# Patient Record
Sex: Male | Born: 1950 | Race: Black or African American | Hispanic: No | Smoking: Current some day smoker
Health system: Southern US, Community
[De-identification: ages and names within clinical notes are randomized; demographics above are authoritative.]

## PROBLEM LIST (undated history)

## (undated) DIAGNOSIS — E119 Type 2 diabetes mellitus without complications: Secondary | ICD-10-CM

## (undated) DIAGNOSIS — E7800 Pure hypercholesterolemia, unspecified: Secondary | ICD-10-CM

## (undated) DIAGNOSIS — C801 Malignant (primary) neoplasm, unspecified: Secondary | ICD-10-CM

## (undated) DIAGNOSIS — I10 Essential (primary) hypertension: Secondary | ICD-10-CM

## (undated) DIAGNOSIS — K219 Gastro-esophageal reflux disease without esophagitis: Secondary | ICD-10-CM

## (undated) HISTORY — DX: Pure hypercholesterolemia, unspecified: E78.00

## (undated) HISTORY — PX: THROAT SURGERY: SHX803

## (undated) HISTORY — DX: Gastro-esophageal reflux disease without esophagitis: K21.9

## (undated) HISTORY — PX: ANKLE SURGERY: SHX546

## (undated) HISTORY — PX: PROSTATE SURGERY: SHX751

---

## 2003-09-15 ENCOUNTER — Emergency Department (HOSPITAL_COMMUNITY): Admission: EM | Admit: 2003-09-15 | Discharge: 2003-09-15 | Payer: Self-pay | Admitting: Emergency Medicine

## 2008-06-16 ENCOUNTER — Emergency Department (HOSPITAL_COMMUNITY): Admission: EM | Admit: 2008-06-16 | Discharge: 2008-06-16 | Payer: Self-pay | Admitting: Emergency Medicine

## 2008-09-24 DEATH — deceased

## 2008-10-19 ENCOUNTER — Emergency Department (HOSPITAL_COMMUNITY): Admission: EM | Admit: 2008-10-19 | Discharge: 2008-10-19 | Payer: Self-pay | Admitting: Emergency Medicine

## 2010-11-25 ENCOUNTER — Encounter: Payer: Self-pay | Admitting: *Deleted

## 2010-11-25 ENCOUNTER — Emergency Department (HOSPITAL_COMMUNITY)
Admission: EM | Admit: 2010-11-25 | Discharge: 2010-11-25 | Disposition: A | Discharge status: Home / Self Care | Payer: PRIVATE HEALTH INSURANCE | Attending: Emergency Medicine | Admitting: Emergency Medicine

## 2010-11-25 DIAGNOSIS — F17200 Nicotine dependence, unspecified, uncomplicated: Secondary | ICD-10-CM | POA: Insufficient documentation

## 2010-11-25 DIAGNOSIS — Z7982 Long term (current) use of aspirin: Secondary | ICD-10-CM | POA: Insufficient documentation

## 2010-11-25 DIAGNOSIS — I10 Essential (primary) hypertension: Secondary | ICD-10-CM | POA: Insufficient documentation

## 2010-11-25 DIAGNOSIS — L0100 Impetigo, unspecified: Secondary | ICD-10-CM | POA: Insufficient documentation

## 2010-11-25 HISTORY — DX: Essential (primary) hypertension: I10

## 2010-11-25 MED ORDER — DOXYCYCLINE HYCLATE 100 MG PO CAPS: 100.0000 mg | ORAL_CAPSULE | Freq: Two times a day (BID) | ORAL | Status: AC

## 2010-11-25 NOTE — ED Provider Notes (Addendum)
History    Scribed for Shelda Jakes, MD, the patient was seen in room APA06/APA06. This chart was scribed by Clarita Crane. This patient's care was started at 8:41AM.  Chief Complaint  Patient presents with  . Wound Infection   HPI Patient c/o bulging abscess to medial aspect of right ankle and a smaller abscess to anterior aspect of right lower leg with a yellow drainage just distal to right knee with associated pruritus onset 4 days ago and worsening since. Patient denies associated pain with abscesses, fever and chills. Patient notes the abscesses initially started as small areas of discoloration to skin and grew progressing to bulging abscesses.Patient repots h/o of similar symptoms with last episode of similar abscesses occuring 2 years ago with the abscesses resolving approximately 3 weeks after onset. Patient reports he was evaluated at [facility] yesterday and dx with impetigo and given a shot of Rocephin-250 mg at [facility]. Reports h/o hypertension and denies h/o diabetes.   PAST MEDICAL HISTORY:  Past Medical History  Diagnosis Date  . Hypertension     PAST SURGICAL HISTORY:  Past Surgical History  Procedure Date  . Ankle surgery     MEDICATIONS:  Previous Medications   ASPIRIN EC 81 MG TABLET    Take 81 mg by mouth daily.     ATENOLOL-CHLORTHALIDONE (TENORETIC) 50-25 MG PER TABLET    Take 0.5 tablets by mouth daily.     MULTIPLE VITAMIN (MULTI VITAMIN MENS) TABLET    Take 1 tablet by mouth daily.     SIMVASTATIN (ZOCOR) 80 MG TABLET    Take 80 mg by mouth at bedtime.       ALLERGIES:  Allergies as of 11/25/2010 - Review Complete 11/25/2010  Allergen Reaction Noted  . Sulfa antibiotics Itching 11/25/2010     FAMILY HISTORY:  Family History  Problem Relation Age of Onset  . Hypertension Mother   . Diabetes Maternal Aunt      SOCIAL HISTORY: History   Social History  . Marital Status: Married    Spouse Name: N/A    Number of  Children: N/A  . Years of Education: N/A   Social History Main Topics  . Smoking status: Current Everyday Smoker  . Smokeless tobacco: None  . Alcohol Use: Yes     occasional  . Drug Use: No  . Sexually Active:    Other Topics Concern  . None   Social History Narrative  . None       Review of Systems  Constitutional: Negative for fever and chills.  HENT: Negative for rhinorrhea and neck pain.   Eyes: Negative for pain.  Respiratory: Negative for cough and shortness of breath.   Cardiovascular: Negative for chest pain.  Gastrointestinal: Negative for nausea, vomiting, abdominal pain and diarrhea.  Genitourinary: Negative for dysuria.  Musculoskeletal: Negative for back pain.  Skin: Negative for rash.       Multiple skin lesions to right lower extremity. Pruritus. Draining lesion.   Neurological: Negative for dizziness and weakness.    Physical Exam  BP 113/84  Pulse 76  Temp 98.2 F (36.8 C)  Resp 20  Wt 190 lb (86.183 kg)  SpO2 100%  Physical Exam  Nursing note and vitals reviewed. Constitutional: He is oriented to person, place, and time. Vital signs are normal. He appears well-developed and well-nourished. No distress.  HENT:  Head: Normocephalic and atraumatic.  Eyes: Conjunctivae are normal. Pupils are equal, round, and reactive to light.  Neck: Neck supple.  Cardiovascular: Normal rate, regular rhythm and normal heart sounds.  Exam reveals no gallop and no friction rub.   No murmur heard.      Distal capillary refill intact.   Pulmonary/Chest: Effort normal and breath sounds normal. He has no wheezes.  Abdominal: Soft. Bowel sounds are normal. He exhibits no distension. There is no tenderness.  Musculoskeletal: Normal range of motion. He exhibits no edema.  Neurological: He is alert and oriented to person, place, and time. No sensory deficit.  Skin: Skin is warm and dry.       Small 5mm lesion with surrounding redness and no blister to medial aspect of  right knee. Lesion 2cm in size just distal to right knee with red base, open blister cover with non purulent honey-colored discharge and no surrounding eythema. 3cm lesion to medial aspect of right ankle 3cm in size with large blister intact. Small punctate lesion to medial aspect of right foot 2mm in size with mild surrounding erythema.  Psychiatric: He has a normal mood and affect. His behavior is normal.    ED Course  Procedures  OTHER DATA REVIEWED: Nursing notes, vital signs, and past medical records reviewed.   LABS / RADIOLOGY: No results found for this or any previous visit. No results found.  ED COURSE / COORDINATION OF CARE: 8:52AM- Patient informed of reasons to return to ED and pertinent at [facility] care instructions. Further explained intent to provide patient with prescription for Doxycycline and discharge home. Patient agrees to plan set forth at this time.  MDM: Differential Diagnosis:   [facility] SUSPECT IMPETIGO AND HAS TREATED THAT APPROPRIATELY WITH ROCHEPH IM THERE AND THEN TOPICAL ANTIBIOTIC MOST LIKELY BATROBAN. WILL ADD DOXY IN CASE SECONDARY MRSA BUT D/C IS HONEY COLORED. ANOTHER POSSIBILITY IS SOME TYPE OF DERMATOLOGIC BULLOUS IMMUNE RESPONSE. WILL TREAT AS IMPETIGO FOR NOW.  I personally performed the services described in this documentation, which was scribed in my presence. The recorded information has been reviewed and considered. No att. providers found   PLAN: Discharge The patient is to return the emergency department if there is any worsening of symptoms. I have reviewed the discharge instructions with the patient/family   CONDITION ON DISCHARGE: Stable, Good   MEDICATIONS GIVEN IN THE E.D.  Medications  simvastatin (ZOCOR) 80 MG tablet (not administered)  atenolol-chlorthalidone (TENORETIC) 50-25 MG per tablet (not administered)  aspirin EC 81 MG tablet (not administered)  Multiple Vitamin (MULTI VITAMIN MENS) tablet (not  administered)         Shelda Jakes, MD 11/25/10 1610  Shelda Jakes, MD 11/25/10 [phone]

## 2010-11-25 NOTE — ED Notes (Signed)
Pt has 2 large blisters on his right leg.

## 2014-02-12 ENCOUNTER — Ambulatory Visit (INDEPENDENT_AMBULATORY_CARE_PROVIDER_SITE_OTHER): Payer: Medicare HMO | Admitting: Orthopedic Surgery

## 2014-02-12 ENCOUNTER — Ambulatory Visit (INDEPENDENT_AMBULATORY_CARE_PROVIDER_SITE_OTHER): Payer: Medicare HMO

## 2014-02-12 ENCOUNTER — Encounter: Payer: Self-pay | Admitting: Orthopedic Surgery

## 2014-02-12 VITALS — BP 133/87 | Ht 68.0 in | Wt 190.0 lb

## 2014-02-12 DIAGNOSIS — D1724 Benign lipomatous neoplasm of skin and subcutaneous tissue of left leg: Secondary | ICD-10-CM

## 2014-02-12 DIAGNOSIS — M25561 Pain in right knee: Secondary | ICD-10-CM

## 2014-02-12 DIAGNOSIS — M7121 Synovial cyst of popliteal space [Baker], right knee: Secondary | ICD-10-CM

## 2014-02-12 NOTE — Progress Notes (Signed)
Chief Complaint  Patient presents with  . Cyst    Right posterior knee cyst, referred by Dr. Alford Highland    The patient presents and says he has a cyst in his knee for the last 4 years. He was told this by his doctor. He had no imaging however. He took some Advil as needed did not help he complains of pain in the back of his knee with swelling catching stiffness throbbing burning stabbing and aching and rated 9/10 and says it's constant it's worse with walking he says his knee doesn't hurt in the front at all just in the popliteal fossa.  Medical history diabetes hypertension and cancer Past history of prostate cancer surgery ankle surgery throat surgery Medication lisinopril 10 atenolol 25 at her best and 40 metformin 1000 omeprazole 20 aspirin 81  Sulfa medication allergy  Family history diabetes hypertension cancer  Social history no smoking no drinking no drug use  Review of Systems  HENT: Positive for congestion.   Respiratory: Positive for wheezing.   Gastrointestinal: Positive for constipation.  Musculoskeletal: Positive for joint pain.  Skin: Positive for rash.  Endo/Heme/Allergies: Positive for environmental allergies.  All other systems reviewed and are negative.   BP 133/87  Ht 5\' 8"  (1.727 m)  Wt 190 lb (86.183 kg)  BMI 28.90 kg/m2  Grooming hygiene are normal he's normal in terms of body habitus. He has good peripheral pulses bilaterally with normal temperature no edema no swelling lymph nodes are negative in the groin gait and station is normal Right knee mass compared to left there is some fullness and tenderness there I can't tell if it's a cyst or lipoma is nontender in his knee no effusion knee flexion passively 125 knee stable has valgus alignment normal muscle strength and muscle tone scans normal without rash sensation is normal he is oriented x3 his mood and affect is normal and his reflexes were normal  His x-ray I interpret as osteoarthritis with valgus  alignment to the right knee  Recommend MRI lipoma versus cyst   Notes reviewed today include office notes proximally a page is. The main information gained from that visit the patient is under good control for his diabetes and hypertension. He previously saw [facility] did not pay his bills and they won't see him again.

## 2014-02-26 ENCOUNTER — Ambulatory Visit: Payer: Commercial Managed Care - HMO | Admitting: Orthopedic Surgery

## 2014-02-27 ENCOUNTER — Ambulatory Visit (HOSPITAL_COMMUNITY)
Admission: RE | Admit: 2014-02-27 | Discharge: 2014-02-27 | Disposition: A | Discharge status: Home / Self Care | Payer: Medicare HMO | Source: Ambulatory Visit | Attending: Orthopedic Surgery | Admitting: Orthopedic Surgery

## 2014-02-27 DIAGNOSIS — S83241A Other tear of medial meniscus, current injury, right knee, initial encounter: Secondary | ICD-10-CM | POA: Diagnosis not present

## 2014-02-27 DIAGNOSIS — M25461 Effusion, right knee: Secondary | ICD-10-CM | POA: Diagnosis not present

## 2014-02-27 DIAGNOSIS — X58XXXA Exposure to other specified factors, initial encounter: Secondary | ICD-10-CM | POA: Insufficient documentation

## 2014-02-27 DIAGNOSIS — M7121 Synovial cyst of popliteal space [Baker], right knee: Secondary | ICD-10-CM | POA: Insufficient documentation

## 2014-02-27 DIAGNOSIS — M25561 Pain in right knee: Secondary | ICD-10-CM | POA: Insufficient documentation

## 2014-03-08 ENCOUNTER — Telehealth: Payer: Self-pay | Admitting: Orthopedic Surgery

## 2014-03-08 NOTE — Telephone Encounter (Signed)
Patient has called to request MRI results by phone.  He had an appointment scheduled for 02/26/14(based on MRI, if had been approved and done) and  the MRI was scheduled and done on 02/27/14.  May he receive results and be further advised by phone?  His contact #'s are:   (home) [phone], and (cell) [phone].

## 2014-03-11 NOTE — Telephone Encounter (Signed)
He has torn cartilage and severe arthritis in the knee   Needs to be re evaluated unless his knee is not bothering him

## 2014-03-12 NOTE — Telephone Encounter (Signed)
Routing to nurse to relay to patient per Dr Ruthe Mannan reviewing of results.

## 2014-03-12 NOTE — Telephone Encounter (Signed)
Patient aware and will call us as needed

## 2014-03-18 NOTE — Telephone Encounter (Signed)
03/18/14 Call back received from patient and wife, following up on the results received.  I reviewed the note per nurse and per Dr Aline Brochure, to come back in for re-evaluation, unless knee is not bothering him.  Patient said that the knee is still bothering him, as it has for the last 2 years.  I offered appointment, however, patient and wife states does not wish to have a visit because of having to pay a copay.  Patient does wish to have nurse call him back at above phone #.Marland Kitchen

## 2015-06-03 ENCOUNTER — Encounter: Payer: Self-pay | Admitting: *Deleted

## 2015-06-17 ENCOUNTER — Encounter (INDEPENDENT_AMBULATORY_CARE_PROVIDER_SITE_OTHER): Payer: Self-pay | Admitting: *Deleted

## 2015-07-16 ENCOUNTER — Encounter (INDEPENDENT_AMBULATORY_CARE_PROVIDER_SITE_OTHER): Payer: Self-pay | Admitting: Internal Medicine

## 2015-07-16 ENCOUNTER — Ambulatory Visit (INDEPENDENT_AMBULATORY_CARE_PROVIDER_SITE_OTHER): Payer: Medicare HMO | Admitting: Internal Medicine

## 2015-07-16 VITALS — BP 146/90 | HR 64 | Temp 97.6°F | Ht 67.0 in | Wt 193.1 lb

## 2015-07-16 DIAGNOSIS — R1013 Epigastric pain: Secondary | ICD-10-CM

## 2015-07-16 DIAGNOSIS — R140 Abdominal distension (gaseous): Secondary | ICD-10-CM

## 2015-07-16 DIAGNOSIS — K219 Gastro-esophageal reflux disease without esophagitis: Secondary | ICD-10-CM | POA: Diagnosis not present

## 2015-07-16 DIAGNOSIS — R1900 Intra-abdominal and pelvic swelling, mass and lump, unspecified site: Secondary | ICD-10-CM | POA: Diagnosis not present

## 2015-07-16 DIAGNOSIS — I10 Essential (primary) hypertension: Secondary | ICD-10-CM | POA: Insufficient documentation

## 2015-07-16 DIAGNOSIS — E7800 Pure hypercholesterolemia, unspecified: Secondary | ICD-10-CM | POA: Insufficient documentation

## 2015-07-16 DIAGNOSIS — E119 Type 2 diabetes mellitus without complications: Secondary | ICD-10-CM | POA: Insufficient documentation

## 2015-07-16 NOTE — Progress Notes (Signed)
   Subjective:    Patient ID: Nicholas Vaughan, male    DOB: 07-02-1950, 65 y.o.   MRN: [id number]  HPI Referred by Dr. Leonie Douglas ([facility]) For abdominal bloating, flatus. He tells me he has a lot of bloating and flatus. He says this occurred about 2 months ago. His Omeprazole was increased to twice a day and bloating resolved.    There is no abdominal pain. He does say he pulled a muscle in his left flank from mopping.  He usually has a BM daily and sometimes twice a day. No melena or BRRB. No change in his stools.  Appetite is good. No weight loss. He is not having "heart burn". Has been on Omeprazole for years.   He last colonoscopy was at age 73 in [place] and it was normal. He had 3 polyps. He says his next colonoscopy will be when he turns 42.   Hx of diabetes, hypertension, high cholesterol.   Review of Systems Past Medical History  Diagnosis Date  . Hypertension   . GERD (gastroesophageal reflux disease)   . High cholesterol     Past Surgical History  Procedure Laterality Date  . Ankle surgery    . Prostate surgery      2013 Prostate cancer    Allergies  Allergen Reactions  . Sulfa Antibiotics Itching    Current Outpatient Prescriptions on File Prior to Visit  Medication Sig Dispense Refill  . aspirin EC 81 MG tablet Take 81 mg by mouth daily.      Marland Kitchen atenolol (TENORMIN) 25 MG tablet Take 25 mg by mouth daily.    Marland Kitchen atorvastatin (LIPITOR) 40 MG tablet Take 40 mg by mouth daily.    . metFORMIN (GLUCOPHAGE) 1000 MG tablet Take 1,000 mg by mouth 2 (two) times daily with a meal.    . Multiple Vitamin (MULTI VITAMIN MENS) tablet Take 1 tablet by mouth daily.      Marland Kitchen omeprazole (PRILOSEC) 20 MG capsule Take 20 mg by mouth daily.     No current facility-administered medications on file prior to visit.        Objective:   Physical Exam Blood pressure 146/90, pulse 64, temperature 97.6 F (36.4 C), height 5\' 7"  (1.702 m), weight 193 lb  1.6 oz (87.59 kg). Alert and oriented. Skin warm and dry. Oral mucosa is moist.   . Sclera anicteric, conjunctivae is pink. Thyroid not enlarged. No cervical lymphadenopathy. Lungs clear. Heart regular rate and rhythm.  Abdomen is soft. Bowel sounds are positive. No hepatomegaly. No abdominal masses felt. No tenderness.  No edema to lower extremities.  Stool guaiac negative.     CARD   Lot [id number] Ex 9/17      Assessment & Plan:  GERD controlled with Omeprazole. Bloating/abdomen becomes tense. Am going to get a CT just to be sure we are not missing anything.   OV in 3 months.

## 2015-07-16 NOTE — Patient Instructions (Signed)
CT abdomen/pelvis with GM

## 2015-07-21 ENCOUNTER — Ambulatory Visit (INDEPENDENT_AMBULATORY_CARE_PROVIDER_SITE_OTHER): Payer: Medicare HMO

## 2015-07-21 ENCOUNTER — Ambulatory Visit (INDEPENDENT_AMBULATORY_CARE_PROVIDER_SITE_OTHER): Payer: Medicare HMO | Admitting: Orthopedic Surgery

## 2015-07-21 VITALS — Ht 67.0 in | Wt 193.0 lb

## 2015-07-21 DIAGNOSIS — S83281D Other tear of lateral meniscus, current injury, right knee, subsequent encounter: Secondary | ICD-10-CM

## 2015-07-21 DIAGNOSIS — M25561 Pain in right knee: Secondary | ICD-10-CM

## 2015-07-21 DIAGNOSIS — M1711 Unilateral primary osteoarthritis, right knee: Secondary | ICD-10-CM

## 2015-07-21 DIAGNOSIS — M7121 Synovial cyst of popliteal space [Baker], right knee: Secondary | ICD-10-CM

## 2015-07-21 MED ORDER — TRAMADOL-ACETAMINOPHEN 37.5-325 MG PO TABS: 1.0000 | ORAL_TABLET | Freq: Four times a day (QID) | ORAL | Status: DC | PRN

## 2015-07-21 NOTE — Progress Notes (Signed)
Patient ID: Nicholas Vaughan, male   DOB: 09-06-1950, 65 y.o.   MRN: [id number]  Chief Complaint  Patient presents with  . Knee Pain    Right knee pain   Last seen October 2020 thousand 15 HPI Nicholas Vaughan is a 65 y.o. male.  This gentleman has pain in his right knee radiates down to his right foot. He has posterior pain with a Baker's cyst. I saw him before you lateral meniscus and osteoarthritis. He was lost to follow-up  He complains planes now on repeat presentation with right knee pain dull throbbing aching unrelieved by Advil moderate in severity present now for about 6 years worse with exercise and walking. Visual giving way symptoms.  Review of Systems Review of Systems  HENT: Positive for dental problem and sinus pressure.   Musculoskeletal: Positive for arthralgias.  Allergic/Immunologic: Positive for environmental allergies.  Neurological:       Burning pain in his legs    Past Medical History  Diagnosis Date  . Hypertension   . GERD (gastroesophageal reflux disease)   . High cholesterol     Past Surgical History  Procedure Laterality Date  . Ankle surgery    . Prostate surgery      2013 Prostate cancer    Family History  Problem Relation Age of Onset  . Diabetes Maternal Aunt     Social History Social History  Substance Use Topics  . Smoking status: Current Every Day Smoker  . Smokeless tobacco: Not on file  . Alcohol Use: 0.0 oz/week    0 Standard drinks or equivalent per week     Comment: occasional once a week    Allergies  Allergen Reactions  . Sulfa Antibiotics Itching    Current Outpatient Prescriptions  Medication Sig Dispense Refill  . aspirin EC 81 MG tablet Take 81 mg by mouth daily.      Marland Kitchen atenolol (TENORMIN) 25 MG tablet Take 25 mg by mouth daily.    Marland Kitchen atorvastatin (LIPITOR) 40 MG tablet Take 40 mg by mouth daily.    . cetirizine (ZYRTEC) 10 MG tablet Take 10 mg by mouth daily.    . metFORMIN (GLUCOPHAGE) 1000 MG tablet Take  1,000 mg by mouth 2 (two) times daily with a meal.    . Multiple Vitamin (MULTI VITAMIN MENS) tablet Take 1 tablet by mouth daily.      Marland Kitchen omeprazole (PRILOSEC) 20 MG capsule Take 20 mg by mouth daily.    Marland Kitchen senna-docusate (SENOKOT-S) 8.6-50 MG tablet Take 1 tablet by mouth daily.    . traMADol-acetaminophen (ULTRACET) 37.5-325 MG tablet Take 1 tablet by mouth every 6 (six) hours as needed. 84 tablet 0   No current facility-administered medications for this visit.       Physical Exam Physical Exam Height 5\' 7"  (1.702 m), weight 193 lb (87.544 kg). Appearance, there are no abnormalities in terms of appearance the patient was well-developed and well-nourished. The grooming and hygiene were normal.  Mental status orientation, there was normal alertness and orientation Mood pleasant Ambulatory status normal with no assistive devices  Examination of the right knee Inspection  lateral joint line tenderness , no effusion. Range of motion 115 Tests for stability normal Motor strength  5 out of 5 quadriceps strength no atrophy no tremors  Positive McMurray sign for lateral meniscus tear  Skin warm dry and intact without laceration or ulceration or erythema Neurologic examination normal sensation Vascular examination normal pulses with warm extremity and normal capillary  refill  The opposite knee reveals no swelling or effusion, full range of motion, normal muscle tone, anterior and posterior drawer test stable neurovascular exam intact  Data Reviewed X-rays today show arthritis in his knee previous MRI showed torn meniscus  Assessment   torn lateral meniscus right knee knee   Plan  I questioned him extensively about his back he has no tenderness or pain there no pain in his hip or upper thigh just pain in the knee with giving way symptoms. He would like to proceed with arthroscopic surgery understanding that he may need total knee replacement in the future  We gave him some  Ultracet for pain is Advil was not controlling his pain taking 1 every other day

## 2015-07-24 ENCOUNTER — Ambulatory Visit (HOSPITAL_COMMUNITY)
Admission: RE | Admit: 2015-07-24 | Discharge: 2015-07-24 | Disposition: A | Discharge status: Home / Self Care | Payer: Medicare HMO | Source: Ambulatory Visit | Attending: Internal Medicine | Admitting: Internal Medicine

## 2015-07-24 DIAGNOSIS — N200 Calculus of kidney: Secondary | ICD-10-CM | POA: Diagnosis not present

## 2015-07-24 DIAGNOSIS — R1900 Intra-abdominal and pelvic swelling, mass and lump, unspecified site: Secondary | ICD-10-CM | POA: Insufficient documentation

## 2015-07-24 DIAGNOSIS — R1013 Epigastric pain: Secondary | ICD-10-CM | POA: Diagnosis present

## 2015-07-24 DIAGNOSIS — R140 Abdominal distension (gaseous): Secondary | ICD-10-CM | POA: Diagnosis present

## 2015-07-24 LAB — POCT I-STAT CREATININE: CREATININE: 1.1 mg/dL (ref 0.61–1.24)

## 2015-07-24 MED ORDER — IOHEXOL 300 MG/ML  SOLN
100.0000 mL | Freq: Once | INTRAMUSCULAR | Status: AC | PRN
  Administered 2015-07-24: 100 mL via INTRAVENOUS

## 2015-09-25 ENCOUNTER — Ambulatory Visit (INDEPENDENT_AMBULATORY_CARE_PROVIDER_SITE_OTHER): Payer: Medicare HMO | Admitting: Orthopedic Surgery

## 2015-09-25 VITALS — BP 109/64 | HR 80 | Ht 65.75 in | Wt 188.6 lb

## 2015-09-25 DIAGNOSIS — S83281D Other tear of lateral meniscus, current injury, right knee, subsequent encounter: Secondary | ICD-10-CM | POA: Diagnosis not present

## 2015-09-25 NOTE — Patient Instructions (Signed)
You have decided to proceed with operative arthroscopy of the knee. You have decided not to continue with nonoperative measures such as but not limited to oral medication, weight loss, activity modification, physical therapy, bracing, or injection.  We will perform operative arthroscopy of the knee. Some of the risks associated with arthroscopic surgery of the knee include but are not limited to Bleeding Infection Swelling Stiffness Blood clot Pain  If you're not comfortable with these risks and would like to continue with nonoperative treatment please let Dr. Harrison know prior to your surgery. 

## 2015-09-25 NOTE — Progress Notes (Signed)
Patient ID: Nicholas Vaughan, male   DOB: Apr 22, 1951, 65 y.o.   MRN: [id number]  Chief Complaint  Patient presents with  . Follow-up    Right knee discuss surgery    HPI Nicholas Vaughan is decided to go ahead with surgery on his right knee complains of bilateral knee pain stiffness swelling a Baker's cyst  ROS  Is not febrile   BP 109/64 mmHg  Pulse 80  Ht 5' 5.75" (1.67 m)  Wt 188 lb 9.6 oz (85.548 kg)  BMI 30.67 kg/m2 Gen. appearance is normal grooming and hygiene Orientation to person place and time normal Mood normal Gait is SLIGHTLY ABNORMAL No peripheral edema or swelling is noted in the LEG- RIGHT  Sensory exam shows normal sensation to palpation, pressure and soft touch Skin exam no lacerations ulcerations or erythema  Ortho Exam   lateral tenderness positive McMurray's for lateral joint pain knee flexion limited 100  A/P  Medical decision-surgical arthroscopy right knee partial lateral meniscectomy at patient's convenience

## 2015-09-26 NOTE — Addendum Note (Signed)
Addended by: Baldomero Lamy B on: 09/26/2015 09:41 AM   Modules accepted: Orders

## 2015-10-08 ENCOUNTER — Telehealth: Payer: Self-pay | Admitting: Orthopaedic Surgery

## 2015-10-08 NOTE — Telephone Encounter (Signed)
Nicholas Vaughan called asking to speak with you. He said that you had left a message for him to call you back.  Please call and advise.

## 2015-10-14 ENCOUNTER — Encounter: Payer: Self-pay | Admitting: Orthopedic Surgery

## 2015-10-14 NOTE — Telephone Encounter (Signed)
Patient stopped in to find out about surgery - said missed nurse's call back regarding pre-op and surgery information.  Also needs work note.

## 2015-10-16 ENCOUNTER — Encounter (INDEPENDENT_AMBULATORY_CARE_PROVIDER_SITE_OTHER): Payer: Self-pay | Admitting: Internal Medicine

## 2015-10-16 ENCOUNTER — Ambulatory Visit (INDEPENDENT_AMBULATORY_CARE_PROVIDER_SITE_OTHER): Payer: Medicare HMO | Admitting: Internal Medicine

## 2015-10-16 ENCOUNTER — Telehealth: Payer: Self-pay | Admitting: Orthopedic Surgery

## 2015-10-16 VITALS — BP 104/60 | HR 64 | Temp 98.1°F | Ht 66.0 in | Wt 188.4 lb

## 2015-10-16 DIAGNOSIS — K219 Gastro-esophageal reflux disease without esophagitis: Secondary | ICD-10-CM | POA: Diagnosis not present

## 2015-10-16 NOTE — Telephone Encounter (Signed)
done

## 2015-10-16 NOTE — Telephone Encounter (Signed)
Patient's wife called saying they were not sure when he is suppose to go for Pre-Op. She says they were told something different than what's on his card. She asked if the nurse would please give her a call.  Please advise

## 2015-10-16 NOTE — Progress Notes (Signed)
   Subjective:    Patient ID: Nicholas Vaughan, male    DOB: [birth year], 65 y.o.   MRN: [id number]  HPI PCP Dr. Leonie Douglas.  Here today for f/u. He was last seen in March for bloating , flatus. Has been taking Omeprazole. He has no complaints today. His appetite is good. He has lost about 5 pounds since his visit in March. He rarely has bloating. Rarely has gas at this time. At [facility] in March chief c/o flatus and bloating.  CT scan was unrevealing. Thee is no abdominal pain. No acid reflux. He usually has a BM x1-2 a day. No melena or BRRB.  No NSAIDS except for Baby ASA. He feels 300% better. States he is going to have ?athroscopy to rt knee in July by Dr. Aline Brochure.   07/24/2015 CT Abdomen/pelvis with CM: bloating,l flatus. IMPRESSION: Tiny nonobstructing renal stones bilaterally. No other focal abnormality is seen.     He last colonoscopy was at age 37 in [place] and it was normal. He had 3 polyps. He says his next colonoscopy will be when he turns 39.   Hx of diabetes, hypertension, high cholesterol.     Review of Systems Past Medical History  Diagnosis Date  . Hypertension   . GERD (gastroesophageal reflux disease)   . High cholesterol     Past Surgical History  Procedure Laterality Date  . Ankle surgery    . Prostate surgery      2013 Prostate cancer    Allergies  Allergen Reactions  . Sulfa Antibiotics Itching    Current Outpatient Prescriptions on File Prior to Visit  Medication Sig Dispense Refill  . aspirin EC 81 MG tablet Take 81 mg by mouth daily.      Marland Kitchen atenolol (TENORMIN) 25 MG tablet Take 25 mg by mouth daily.    Marland Kitchen atorvastatin (LIPITOR) 40 MG tablet Take 80 mg by mouth daily.     . cetirizine (ZYRTEC) 10 MG tablet Take 10 mg by mouth daily.    . colchicine 0.6 MG tablet Take 0.6 mg by mouth daily.    . metFORMIN (GLUCOPHAGE) 1000 MG tablet Take 1,000 mg by mouth 2 (two) times daily with a meal.    . omeprazole (PRILOSEC) 20 MG capsule Take  40 mg by mouth daily.     Marland Kitchen senna-docusate (SENOKOT-S) 8.6-50 MG tablet Take 1 tablet by mouth daily.    . Multiple Vitamin (MULTI VITAMIN MENS) tablet Take 1 tablet by mouth daily. Reported on 10/16/2015    . traMADol-acetaminophen (ULTRACET) 37.5-325 MG tablet Take 1 tablet by mouth every 6 (six) hours as needed. (Patient not taking: Reported on 10/16/2015) 84 tablet 0   No current facility-administered medications on file prior to visit.        Objective:   Physical Exam Blood pressure 104/60, pulse 64, temperature 98.1 F (36.7 C), height 5\' 6"  (1.676 m), weight 188 lb 6.4 oz (85.458 kg).  Alert and oriented. Skin warm and dry. Oral mucosa is moist.   . Sclera anicteric, conjunctivae is pink. Thyroid not enlarged. No cervical lymphadenopathy. Lungs clear. Heart regular rate and rhythm.  Abdomen is soft, obese.. Bowel sounds are positive. No hepatomegaly. No abdominal masses felt. No tenderness.  No edema to lower extremities.       Assessment & Plan:  GERD controlled with Omeprazole. OV in 1 year.

## 2015-10-16 NOTE — Patient Instructions (Addendum)
Continue the Omeprazole.  OV in 1 year.  

## 2015-10-16 NOTE — Telephone Encounter (Signed)
Called patient's wife and gave her the dates and times

## 2015-10-21 NOTE — Patient Instructions (Signed)
Nicholas Vaughan  10/21/2015     @PREFPERIOPPHARMACY @   Your procedure is scheduled on  10/30/2015   Report to [facility] at  10  A.M.  Call this number if you have problems the morning of surgery:  [phone]   Remember:  Do not eat food or drink liquids after midnight.  Take these medicines the morning of surgery with A SIP OF WATER  Atenolol, zyrtec, prilosec, ultracet.   Do not wear jewelry, make-up or nail polish.  Do not wear lotions, powders, or perfumes.  You may wear deoderant.  Do not shave 48 hours prior to surgery.  Men may shave face and neck.  Do not bring valuables to the hospital.  [facility] is not responsible for any belongings or valuables.  Contacts, dentures or bridgework may not be worn into surgery.  Leave your suitcase in the car.  After surgery it may be brought to your room.  For patients admitted to the hospital, discharge time will be determined by your treatment team.  Patients discharged the day of surgery will not be allowed to drive home.   Name and phone number of your driver:   family Special instructions:  none  Please read over the following fact sheets that you were given. Coughing and Deep Breathing, Surgical Site Infection Prevention, Anesthesia Post-op Instructions and Care and Recovery After Surgery      Meniscus Injury, Arthroscopy Arthroscopy is a surgical procedure that involves the use of a small scope that has a camera and surgical instruments on the end (arthroscope). An arthroscope can be used to repair your meniscus injury.  LET [facility] CARE PROVIDER KNOW ABOUT:  Any allergies you have.  All medicines you are taking, including vitamins, herbs, eyedrops, creams, and over-the-counter medicines.  Any recent colds or infections you have had or currently have.  Previous problems you or members of your family have had with the use of anesthetics.  Any blood disorders or blood clotting problems you  have.  Previous surgeries you have had.  Medical conditions you have. RISKS AND COMPLICATIONS Generally, this is a safe procedure. However, as with any procedure, problems can occur. Possible problems include:  Damage to nerves or blood vessels.  Excess bleeding.  Blood clots.  Infection. BEFORE THE PROCEDURE  Do not eat or drink for 6-8 hours before the procedure.  Take medicines as directed by your surgeon. Ask your surgeon about changing or stopping your regular medicines.  You may have lab tests the morning of surgery. PROCEDURE  You will be given one of the following:   A medicine that numbs the area (local anesthesia).  A medicine that makes you go to sleep (general anesthesia).  A medicine injected into your spine that numbs your body below the waist (spinal anesthesia). Most often, several small cuts (incisions) are made in the knee. The arthroscope and instruments go into the incisions to repair the damage. The torn portion of the meniscus is removed.  During this time, your surgeon may find a partial or complete tear in a cruciate ligament, such as the anterior cruciate ligament (ACL). A completely torn cruciate ligament is reconstructed by taking tissue from another part of the body (grafting) and placing it into the injured area. This requires several larger incisions to complete the repair. Sometimes, open surgery is needed for collateral ligament injuries. If a collateral ligament is found to be injured, your surgeon may staple  or suture the tear through a slightly larger incision on the side of the knee. AFTER THE PROCEDURE You will be taken to the recovery area where your progress will be monitored. When you are awake, stable, and taking fluids without complications, you will be allowed to go home. This is usually the same day. However, more extensive repairs of a ligament may require an overnight stay.  The recovery time after repairing your meniscus or ligament  depends on the amount of damage to these structures. It also depends on whether or not reconstructive knee surgery was needed.   A torn or stretched ligament (ligament sprain) may take 6-8 weeks to heal. It takes about the same amount of time if your surgeon removed a torn meniscus.  A repaired meniscus may require 6-12 weeks of recovery time.  A torn ligament needing reconstructive surgery may take 6-12 months to heal fully.   This information is not intended to replace advice given to you by your health care provider. Make sure you discuss any questions you have with your health care provider.   Document Released: 04/09/2000 Document Revised: 04/17/2013 Document Reviewed: 09/08/2012 Elsevier Interactive Patient Education 2016 [employer]. Arthroscopy, With Meniscus Injury, Care After Refer to this sheet in the next few weeks. These instructions provide you with general information on caring for yourself after your procedure. Your health care provider may also give you specific instructions. Your treatment has been planned according to the current medical practices, but problems sometimes occur. Call your health care provider if you have any problems or questions after your procedure. WHAT TO EXPECT AFTER THE PROCEDURE After your procedure, it is typical to have the following:  Pain and swelling in your knee.  Constipation.  Difficulty walking. HOME CARE INSTRUCTIONS   Use crutches and do knee exercises as directed by your health care provider.  Apply ice to the injured area:  Put ice in a plastic bag.  Place a towel between your skin and the bag.  Leave the ice on for 15-20 minutes, 3-4 times a day while awake. Do this for the first 2 days.  Rest and raise (elevate) your knee.  Change bandages (dressings) as directed by your health care provider.  Keep the wound dry and clean. The wound may be washed gently with soap and water. Gently blot or dab the wound dry. It is okay to  take showers 24-48 hours after surgery. Do not take baths, use swimming pools, or use hot tubs for 14 days, or as directed by your health care provider.  Only take over-the-counter or prescription medicines for pain, discomfort, or fever as directed by your health care provider.  Continue your normal diet as directed by your health care provider.  Do not lift anything more than 10 pounds or play contact sports for 3 weeks, or as directed by your health care provider.  If a brace was applied, use as directed by your health care provider.  Your health care provider will help with instructions for rehabilitation of your knee. SEEK MEDICAL CARE IF:   You have increased bleeding (more than a small spot) from the wound.  You have redness, swelling, or increasing pain in the wound.  Yellowish-white fluid (pus) is coming from your wound. SEEK IMMEDIATE MEDICAL CARE IF:   You develop a rash.  You have a fever or persistent symptoms for more than 2-3 days.  You have difficulty breathing.  You have increasing pain with movement of the knee. MAKE SURE  YOU:   Understand these instructions.  Will watch your condition.  Will get help right away if you are not doing well or get worse.   This information is not intended to replace advice given to you by your health care provider. Make sure you discuss any questions you have with your health care provider.   Document Released: 10/30/2004 Document Revised: 12/13/2012 Document Reviewed: 09/19/2012 Elsevier Interactive Patient Education 2016 [employer] PATIENT INSTRUCTIONS POST-ANESTHESIA  IMMEDIATELY FOLLOWING SURGERY:  Do not drive or operate machinery for the first twenty four hours after surgery.  Do not make any important decisions for twenty four hours after surgery or while taking narcotic pain medications or sedatives.  If you develop intractable nausea and vomiting or a severe headache please notify your doctor  immediately.  FOLLOW-UP:  Please make an appointment with your surgeon as instructed. You do not need to follow up with anesthesia unless specifically instructed to do so.  WOUND CARE INSTRUCTIONS (if applicable):  Keep a dry clean dressing on the anesthesia/puncture wound site if there is drainage.  Once the wound has quit draining you may leave it open to air.  Generally you should leave the bandage intact for twenty four hours unless there is drainage.  If the epidural site drains for more than 36-48 hours please call the anesthesia department.  QUESTIONS?:  Please feel free to call your physician or the hospital operator if you have any questions, and they will be happy to assist you.

## 2015-10-23 ENCOUNTER — Other Ambulatory Visit: Payer: Self-pay

## 2015-10-23 ENCOUNTER — Encounter (HOSPITAL_COMMUNITY)
Admission: RE | Admit: 2015-10-23 | Discharge: 2015-10-23 | Disposition: A | Discharge status: Home / Self Care | Payer: Medicare HMO | Source: Ambulatory Visit | Attending: Orthopedic Surgery | Admitting: Orthopedic Surgery

## 2015-10-23 ENCOUNTER — Encounter (HOSPITAL_COMMUNITY): Payer: Self-pay

## 2015-10-23 DIAGNOSIS — Z01810 Encounter for preprocedural cardiovascular examination: Secondary | ICD-10-CM | POA: Diagnosis not present

## 2015-10-23 DIAGNOSIS — Z01812 Encounter for preprocedural laboratory examination: Secondary | ICD-10-CM | POA: Diagnosis present

## 2015-10-23 LAB — CBC WITH DIFFERENTIAL/PLATELET
BASOS ABS: 0 10*3/uL (ref 0.0–0.1)
Basophils Relative: 1 %
EOS PCT: 5 %
Eosinophils Absolute: 0.2 10*3/uL (ref 0.0–0.7)
HCT: 38.7 % — ABNORMAL LOW (ref 39.0–52.0)
Hemoglobin: 12.7 g/dL — ABNORMAL LOW (ref 13.0–17.0)
LYMPHS PCT: 41 %
Lymphs Abs: 1.7 10*3/uL (ref 0.7–4.0)
MCH: 30.3 pg (ref 26.0–34.0)
MCHC: 32.8 g/dL (ref 30.0–36.0)
MCV: 92.4 fL (ref 78.0–100.0)
MONO ABS: 0.3 10*3/uL (ref 0.1–1.0)
Monocytes Relative: 8 %
Neutro Abs: 1.9 10*3/uL (ref 1.7–7.7)
Neutrophils Relative %: 45 %
PLATELETS: 285 10*3/uL (ref 150–400)
RBC: 4.19 MIL/uL — ABNORMAL LOW (ref 4.22–5.81)
RDW: 13.6 % (ref 11.5–15.5)
WBC: 4.2 10*3/uL (ref 4.0–10.5)

## 2015-10-23 LAB — BASIC METABOLIC PANEL
ANION GAP: 6 (ref 5–15)
BUN: 18 mg/dL (ref 6–20)
CALCIUM: 8.9 mg/dL (ref 8.9–10.3)
CO2: 26 mmol/L (ref 22–32)
CREATININE: 1.08 mg/dL (ref 0.61–1.24)
Chloride: 106 mmol/L (ref 101–111)
GFR calc Af Amer: >60 mL/min (ref >60)
GLUCOSE: 112 mg/dL — AB (ref 65–99)
Potassium: 4.3 mmol/L (ref 3.5–5.1)
Sodium: 138 mmol/L (ref 135–145)

## 2015-10-30 ENCOUNTER — Encounter (HOSPITAL_COMMUNITY): Payer: Self-pay | Admitting: *Deleted

## 2015-10-30 ENCOUNTER — Encounter (HOSPITAL_COMMUNITY)
Admission: RE | Disposition: A | Discharge status: Home / Self Care | Payer: Self-pay | Source: Ambulatory Visit | Attending: Orthopedic Surgery

## 2015-10-30 ENCOUNTER — Ambulatory Visit (HOSPITAL_COMMUNITY): Payer: Medicare HMO | Admitting: Anesthesiology

## 2015-10-30 ENCOUNTER — Telehealth: Payer: Self-pay | Admitting: Orthopedic Surgery

## 2015-10-30 ENCOUNTER — Ambulatory Visit (HOSPITAL_COMMUNITY)
Admission: RE | Admit: 2015-10-30 | Discharge: 2015-10-30 | Disposition: A | Discharge status: Home / Self Care | Payer: Medicare HMO | Source: Ambulatory Visit | Attending: Orthopedic Surgery | Admitting: Orthopedic Surgery

## 2015-10-30 DIAGNOSIS — Z882 Allergy status to sulfonamides status: Secondary | ICD-10-CM | POA: Insufficient documentation

## 2015-10-30 DIAGNOSIS — M1711 Unilateral primary osteoarthritis, right knee: Secondary | ICD-10-CM | POA: Diagnosis not present

## 2015-10-30 DIAGNOSIS — Z79899 Other long term (current) drug therapy: Secondary | ICD-10-CM | POA: Insufficient documentation

## 2015-10-30 DIAGNOSIS — S83281A Other tear of lateral meniscus, current injury, right knee, initial encounter: Secondary | ICD-10-CM | POA: Diagnosis not present

## 2015-10-30 DIAGNOSIS — K219 Gastro-esophageal reflux disease without esophagitis: Secondary | ICD-10-CM | POA: Insufficient documentation

## 2015-10-30 DIAGNOSIS — S83289A Other tear of lateral meniscus, current injury, unspecified knee, initial encounter: Secondary | ICD-10-CM | POA: Insufficient documentation

## 2015-10-30 DIAGNOSIS — Z7982 Long term (current) use of aspirin: Secondary | ICD-10-CM | POA: Diagnosis not present

## 2015-10-30 DIAGNOSIS — S83241A Other tear of medial meniscus, current injury, right knee, initial encounter: Secondary | ICD-10-CM | POA: Insufficient documentation

## 2015-10-30 DIAGNOSIS — M23321 Other meniscus derangements, posterior horn of medial meniscus, right knee: Secondary | ICD-10-CM | POA: Diagnosis not present

## 2015-10-30 DIAGNOSIS — M23329 Other meniscus derangements, posterior horn of medial meniscus, unspecified knee: Secondary | ICD-10-CM | POA: Insufficient documentation

## 2015-10-30 DIAGNOSIS — I10 Essential (primary) hypertension: Secondary | ICD-10-CM | POA: Diagnosis not present

## 2015-10-30 DIAGNOSIS — E7800 Pure hypercholesterolemia, unspecified: Secondary | ICD-10-CM | POA: Insufficient documentation

## 2015-10-30 DIAGNOSIS — E119 Type 2 diabetes mellitus without complications: Secondary | ICD-10-CM | POA: Insufficient documentation

## 2015-10-30 DIAGNOSIS — F17200 Nicotine dependence, unspecified, uncomplicated: Secondary | ICD-10-CM | POA: Diagnosis not present

## 2015-10-30 DIAGNOSIS — Z7984 Long term (current) use of oral hypoglycemic drugs: Secondary | ICD-10-CM | POA: Insufficient documentation

## 2015-10-30 HISTORY — PX: KNEE ARTHROSCOPY WITH LATERAL MENISECTOMY: SHX6193

## 2015-10-30 LAB — GLUCOSE, CAPILLARY
GLUCOSE-CAPILLARY: 101 mg/dL — AB (ref 65–99)
GLUCOSE-CAPILLARY: 106 mg/dL — AB (ref 65–99)

## 2015-10-30 SURGERY — ARTHROSCOPY, KNEE, WITH LATERAL MENISCECTOMY
Anesthesia: General | Site: Knee | Laterality: Right

## 2015-10-30 MED ORDER — BUPIVACAINE-EPINEPHRINE (PF) 0.5% -1:200000 IJ SOLN
INTRAMUSCULAR | Status: DC | PRN
  Administered 2015-10-30: 60 mL

## 2015-10-30 MED ORDER — SODIUM CHLORIDE 0.9 % IR SOLN
Status: DC | PRN
  Administered 2015-10-30 (×6): 3000 mL

## 2015-10-30 MED ORDER — ONDANSETRON HCL 4 MG/2ML IJ SOLN
4.0000 mg | Freq: Once | INTRAMUSCULAR | Status: DC | PRN
  Filled 2015-10-30: qty 2

## 2015-10-30 MED ORDER — LIDOCAINE HCL (CARDIAC) 10 MG/ML IV SOLN
INTRAVENOUS | Status: DC | PRN
  Administered 2015-10-30: 50 mg via INTRAVENOUS

## 2015-10-30 MED ORDER — GLYCOPYRROLATE 0.2 MG/ML IJ SOLN
INTRAMUSCULAR | Status: AC
  Filled 2015-10-30: qty 3

## 2015-10-30 MED ORDER — PROMETHAZINE HCL 12.5 MG PO TABS: 12.5000 mg | ORAL_TABLET | Freq: Four times a day (QID) | ORAL | Status: DC | PRN

## 2015-10-30 MED ORDER — PROPOFOL 10 MG/ML IV BOLUS
INTRAVENOUS | Status: DC | PRN
  Administered 2015-10-30: 60 mg via INTRAVENOUS
  Administered 2015-10-30: 10 mg via INTRAVENOUS
  Administered 2015-10-30: 150 mg via INTRAVENOUS
  Administered 2015-10-30: 50 mg via INTRAVENOUS

## 2015-10-30 MED ORDER — ONDANSETRON HCL 4 MG/2ML IJ SOLN
4.0000 mg | Freq: Once | INTRAMUSCULAR | Status: AC
  Administered 2015-10-30: 4 mg via INTRAVENOUS

## 2015-10-30 MED ORDER — EPHEDRINE SULFATE 50 MG/ML IJ SOLN
INTRAMUSCULAR | Status: AC
  Filled 2015-10-30: qty 1

## 2015-10-30 MED ORDER — EPINEPHRINE HCL 1 MG/ML IJ SOLN
INTRAMUSCULAR | Status: AC
  Filled 2015-10-30: qty 5

## 2015-10-30 MED ORDER — NEOSTIGMINE METHYLSULFATE 10 MG/10ML IV SOLN
INTRAVENOUS | Status: AC
  Filled 2015-10-30: qty 1

## 2015-10-30 MED ORDER — MIDAZOLAM HCL 2 MG/2ML IJ SOLN
1.0000 mg | INTRAMUSCULAR | Status: DC | PRN
  Administered 2015-10-30: 2 mg via INTRAVENOUS

## 2015-10-30 MED ORDER — FENTANYL CITRATE (PF) 100 MCG/2ML IJ SOLN
INTRAMUSCULAR | Status: AC
  Filled 2015-10-30: qty 2

## 2015-10-30 MED ORDER — ROCURONIUM BROMIDE 100 MG/10ML IV SOLN: INTRAVENOUS | Status: DC | PRN

## 2015-10-30 MED ORDER — ROCURONIUM BROMIDE 100 MG/10ML IV SOLN
INTRAVENOUS | Status: DC | PRN
  Administered 2015-10-30: 10 mg via INTRAVENOUS
  Administered 2015-10-30: 30 mg via INTRAVENOUS

## 2015-10-30 MED ORDER — LACTATED RINGERS IV SOLN
INTRAVENOUS | Status: DC
  Administered 2015-10-30: 09:00:00 via INTRAVENOUS
  Administered 2015-10-30: 1000 mL via INTRAVENOUS

## 2015-10-30 MED ORDER — HYDROCODONE-ACETAMINOPHEN 7.5-325 MG PO TABS
ORAL_TABLET | ORAL | Status: AC
  Filled 2015-10-30: qty 1

## 2015-10-30 MED ORDER — KETOROLAC TROMETHAMINE 30 MG/ML IJ SOLN
INTRAMUSCULAR | Status: AC
  Filled 2015-10-30: qty 1

## 2015-10-30 MED ORDER — HYDROCODONE-ACETAMINOPHEN 7.5-325 MG PO TABS: 1.0000 | ORAL_TABLET | ORAL | Status: DC | PRN

## 2015-10-30 MED ORDER — NEOSTIGMINE METHYLSULFATE 10 MG/10ML IV SOLN
INTRAVENOUS | Status: DC | PRN
  Administered 2015-10-30: 3 mg via INTRAVENOUS

## 2015-10-30 MED ORDER — BUPIVACAINE-EPINEPHRINE (PF) 0.5% -1:200000 IJ SOLN
INTRAMUSCULAR | Status: AC
  Filled 2015-10-30: qty 60

## 2015-10-30 MED ORDER — CEFAZOLIN SODIUM-DEXTROSE 2-4 GM/100ML-% IV SOLN
2.0000 g | INTRAVENOUS | Status: AC
  Administered 2015-10-30: 2 g via INTRAVENOUS
  Filled 2015-10-30: qty 100

## 2015-10-30 MED ORDER — ONDANSETRON HCL 4 MG/2ML IJ SOLN
INTRAMUSCULAR | Status: AC
  Filled 2015-10-30: qty 2

## 2015-10-30 MED ORDER — SODIUM CHLORIDE 0.9 % IJ SOLN
INTRAMUSCULAR | Status: AC
  Filled 2015-10-30: qty 10

## 2015-10-30 MED ORDER — LIDOCAINE HCL (PF) 1 % IJ SOLN
INTRAMUSCULAR | Status: AC
  Filled 2015-10-30: qty 5

## 2015-10-30 MED ORDER — MIDAZOLAM HCL 2 MG/2ML IJ SOLN
INTRAMUSCULAR | Status: AC
  Filled 2015-10-30: qty 2

## 2015-10-30 MED ORDER — ROCURONIUM BROMIDE 50 MG/5ML IV SOLN
INTRAVENOUS | Status: AC
  Filled 2015-10-30: qty 1

## 2015-10-30 MED ORDER — KETOROLAC TROMETHAMINE 30 MG/ML IJ SOLN
30.0000 mg | Freq: Once | INTRAMUSCULAR | Status: AC
  Administered 2015-10-30: 30 mg via INTRAVENOUS

## 2015-10-30 MED ORDER — EPHEDRINE SULFATE 50 MG/ML IJ SOLN
INTRAMUSCULAR | Status: DC | PRN
  Administered 2015-10-30: 5 mg via INTRAVENOUS

## 2015-10-30 MED ORDER — FENTANYL CITRATE (PF) 100 MCG/2ML IJ SOLN
25.0000 ug | INTRAMUSCULAR | Status: AC | PRN
  Administered 2015-10-30 (×2): 25 ug via INTRAVENOUS

## 2015-10-30 MED ORDER — CHLORHEXIDINE GLUCONATE 4 % EX LIQD: 60.0000 mL | Freq: Once | CUTANEOUS | Status: DC

## 2015-10-30 MED ORDER — PROPOFOL 10 MG/ML IV BOLUS
INTRAVENOUS | Status: AC
  Filled 2015-10-30: qty 20

## 2015-10-30 MED ORDER — GLYCOPYRROLATE 0.2 MG/ML IJ SOLN
INTRAMUSCULAR | Status: DC | PRN
  Administered 2015-10-30: 0.6 mg via INTRAVENOUS

## 2015-10-30 MED ORDER — HYDROCODONE-ACETAMINOPHEN 7.5-325 MG PO TABS
1.0000 | ORAL_TABLET | Freq: Once | ORAL | Status: AC
  Administered 2015-10-30: 1 via ORAL

## 2015-10-30 MED ORDER — FENTANYL CITRATE (PF) 100 MCG/2ML IJ SOLN: 25.0000 ug | INTRAMUSCULAR | Status: DC | PRN

## 2015-10-30 MED ORDER — SUCCINYLCHOLINE CHLORIDE 20 MG/ML IJ SOLN
INTRAMUSCULAR | Status: AC
  Filled 2015-10-30: qty 1

## 2015-10-30 MED ORDER — FENTANYL CITRATE (PF) 100 MCG/2ML IJ SOLN
INTRAMUSCULAR | Status: DC | PRN
  Administered 2015-10-30 (×2): 50 ug via INTRAVENOUS

## 2015-10-30 SURGICAL SUPPLY — 46 items
BAG HAMPER (MISCELLANEOUS) ×3 IMPLANT
BANDAGE ELASTIC 6 LF NS (GAUZE/BANDAGES/DRESSINGS) ×3 IMPLANT
BLADE 11 SAFETY STRL DISP (BLADE) ×3 IMPLANT
BLADE AGGRESSIVE PLUS 4.0 (BLADE) ×3 IMPLANT
CHLORAPREP W/TINT 26ML (MISCELLANEOUS) ×3 IMPLANT
CLOTH BEACON ORANGE TIMEOUT ST (SAFETY) ×3 IMPLANT
CUFF CRYO KNEE18X23 MED (MISCELLANEOUS) ×3 IMPLANT
CUFF TOURNIQUET SINGLE 34IN LL (TOURNIQUET CUFF) ×3 IMPLANT
DECANTER SPIKE VIAL GLASS SM (MISCELLANEOUS) ×6 IMPLANT
GAUZE SPONGE 4X4 12PLY STRL (GAUZE/BANDAGES/DRESSINGS) ×3 IMPLANT
GAUZE SPONGE 4X4 16PLY XRAY LF (GAUZE/BANDAGES/DRESSINGS) ×3 IMPLANT
GAUZE XEROFORM 5X9 LF (GAUZE/BANDAGES/DRESSINGS) ×3 IMPLANT
GLOVE BIOGEL PI IND STRL 7.0 (GLOVE) ×3 IMPLANT
GLOVE BIOGEL PI INDICATOR 7.0 (GLOVE) ×6
GLOVE ECLIPSE 6.5 STRL STRAW (GLOVE) ×3 IMPLANT
GLOVE ECLIPSE 7.0 STRL STRAW (GLOVE) ×3 IMPLANT
GLOVE SKINSENSE NS SZ8.0 LF (GLOVE) ×2
GLOVE SKINSENSE STRL SZ8.0 LF (GLOVE) ×1 IMPLANT
GLOVE SS N UNI LF 8.5 STRL (GLOVE) ×3 IMPLANT
GOWN STRL REUS W/TWL LRG LVL3 (GOWN DISPOSABLE) ×9 IMPLANT
GOWN STRL REUS W/TWL XL LVL3 (GOWN DISPOSABLE) ×3 IMPLANT
HLDR LEG FOAM (MISCELLANEOUS) ×1 IMPLANT
IV NS IRRIG 3000ML ARTHROMATIC (IV SOLUTION) ×18 IMPLANT
KIT BLADEGUARD II DBL (SET/KITS/TRAYS/PACK) ×3 IMPLANT
KIT ROOM TURNOVER AP CYSTO (KITS) ×3 IMPLANT
LEG HOLDER FOAM (MISCELLANEOUS) ×2
MAINTENANCE ADDITIVE COOL VEST (MISCELLANEOUS) ×3 IMPLANT
MANIFOLD NEPTUNE II (INSTRUMENTS) ×3 IMPLANT
MARKER SKIN DUAL TIP RULER LAB (MISCELLANEOUS) ×3 IMPLANT
NEEDLE HYPO 18GX1.5 BLUNT FILL (NEEDLE) ×3 IMPLANT
NEEDLE HYPO 21X1.5 SAFETY (NEEDLE) ×3 IMPLANT
NEEDLE SPNL 18GX3.5 QUINCKE PK (NEEDLE) ×3 IMPLANT
NS IRRIG 1000ML POUR BTL (IV SOLUTION) ×3 IMPLANT
PACK ARTHRO LIMB DRAPE STRL (MISCELLANEOUS) ×3 IMPLANT
PAD ABD 5X9 TENDERSORB (GAUZE/BANDAGES/DRESSINGS) ×3 IMPLANT
PAD ARMBOARD 7.5X6 YLW CONV (MISCELLANEOUS) ×3 IMPLANT
PADDING CAST COTTON 6X4 STRL (CAST SUPPLIES) ×3 IMPLANT
PADDING WEBRIL 6 STERILE (GAUZE/BANDAGES/DRESSINGS) ×3 IMPLANT
SET ARTHROSCOPY INST (INSTRUMENTS) ×3 IMPLANT
SET ARTHROSCOPY PUMP TUBE (IRRIGATION / IRRIGATOR) ×3 IMPLANT
SET BASIN LINEN APH (SET/KITS/TRAYS/PACK) ×3 IMPLANT
SUT ETHILON 3 0 FSL (SUTURE) ×3 IMPLANT
SYR 30ML LL (SYRINGE) ×3 IMPLANT
SYRINGE 10CC LL (SYRINGE) ×3 IMPLANT
TUBE CONNECTING 12'X1/4 (SUCTIONS) ×3
TUBE CONNECTING 12X1/4 (SUCTIONS) ×6 IMPLANT

## 2015-10-30 NOTE — Anesthesia Procedure Notes (Signed)
Procedure Name: Intubation Date/Time: 10/30/2015 8:28 AM Performed by: Vista Deck Pre-anesthesia Checklist: Patient identified, Patient being monitored, Timeout performed, Emergency Drugs available and Suction available Patient Re-evaluated:Patient Re-evaluated prior to inductionOxygen Delivery Method: Circle system utilized Preoxygenation: Pre-oxygenation with 100% oxygen Intubation Type: IV induction Ventilation: Mask ventilation without difficulty Laryngoscope Size: Mac and 3 Grade View: Grade I Tube type: Oral Tube size: 8.0 mm Number of attempts: 1 Airway Equipment and Method: Stylet and Oral airway Placement Confirmation: ETT inserted through vocal cords under direct vision,  positive ETCO2 and breath sounds checked- equal and bilateral Secured at: 23 cm Tube secured with: Tape Dental Injury: Teeth and Oropharynx as per pre-operative assessment

## 2015-10-30 NOTE — Anesthesia Preprocedure Evaluation (Signed)
Anesthesia Evaluation  Patient identified by MRN, date of birth, ID band Patient awake    Reviewed: Allergy & Precautions, NPO status , Patient's Chart, lab work & pertinent test results  Airway Mallampati: III  TM Distance: >3 FB     Dental  (+) Poor Dentition, Missing   Pulmonary former smoker,    breath sounds clear to auscultation       Cardiovascular hypertension, Pt. on medications  Rhythm:Regular Rate:Normal     Neuro/Psych    GI/Hepatic GERD  Controlled and Medicated,  Endo/Other  diabetes, Type 2, Oral Hypoglycemic Agents  Renal/GU      Musculoskeletal   Abdominal   Peds  Hematology   Anesthesia Other Findings   Reproductive/Obstetrics                             Anesthesia Physical Anesthesia Plan  ASA: III  Anesthesia Plan: General   Post-op Pain Management:    Induction: Intravenous  Airway Management Planned: LMA  Additional Equipment:   Intra-op Plan:   Post-operative Plan: Extubation in OR  Informed Consent: I have reviewed the patients History and Physical, chart, labs and discussed the procedure including the risks, benefits and alternatives for the proposed anesthesia with the patient or authorized representative who has indicated his/her understanding and acceptance.     Plan Discussed with:   Anesthesia Plan Comments:         Anesthesia Quick Evaluation

## 2015-10-30 NOTE — Telephone Encounter (Signed)
Per Dr. Aline Brochure keep appointment as he stated

## 2015-10-30 NOTE — Discharge Instructions (Signed)
Remove the Cryo/Cuff when you are walking  Remove the dressing tomorrow, apply band-aids  Start knee exercises by bending the knee 253 times a day on the second day after surgery  Apply weight to the operative knee and leg as tolerated  YOUR APPT HAS BEEN CHANGED TO 7/20  Knee Arthroscopy, Care After Refer to this sheet in the next few weeks. These instructions provide you with information about caring for yourself after your procedure. Your health care provider may also give you more specific instructions. Your treatment has been planned according to current medical practices, but problems sometimes occur. Call your health care provider if you have any problems or questions after your procedure. WHAT TO EXPECT AFTER THE PROCEDURE After your procedure, it is common to have:  Soreness.  Pain. HOME CARE INSTRUCTIONS Bathing  Do not take baths, swim, or use a hot tub until your health care provider approves. Incision Care  There are many different ways to close and cover an incision, including stitches, skin glue, and adhesive strips. Follow your health care provider's instructions about:  Incision care.  Bandage (dressing) changes and removal.  Incision closure removal.  Check your incision area every day for signs of infection. Watch for:  Redness, swelling, or pain.  Fluid, blood, or pus. Activity  Avoid strenuous activities for as long as directed by your health care provider.  Return to your normal activities as directed by your health care provider. Ask your health care provider what activities are safe for you.  Perform range-of-motion exercises only as directed by your health care provider.  Do not lift anything that is heavier than 10 lb (4.5 kg).  Do not drive or operate heavy machinery while taking pain medicine.  If you were given crutches, use them as directed by your health care provider. Managing Pain, Stiffness, and Swelling  If directed, apply ice to  the injured area:  Put ice in a plastic bag.  Place a towel between your skin and the bag.  Leave the ice on for 20 minutes, 2-3 times per day.  Raise the injured area above the level of your heart while you are sitting or lying down as directed by your health care provider. General Instructions  Keep all follow-up visits as directed by your health care provider. This is important.  Take medicines only as directed by your health care provider.  Do not use any tobacco products, including cigarettes, chewing tobacco, or electronic cigarettes. If you need help quitting, ask your health care provider.  If you were given compression stockings, wear them as directed by your health care provider. These stockings help prevent blood clots and reduce swelling in your legs. SEEK MEDICAL CARE IF:  You have severe pain with any movement of your knee.  You notice a bad smell coming from the incision or dressing.  You have redness, swelling, or pain at the site of your incision.  You have fluid, blood, or pus coming from your incision. SEEK IMMEDIATE MEDICAL CARE IF:  You develop a rash.  You have a fever.  You have difficulty breathing or have shortness of breath.  You develop pain in your calves or in the back of your knee.  You develop chest pain.  You develop numbness or tingling in your leg or foot.   This information is not intended to replace advice given to you by your health care provider. Make sure you discuss any questions you have with your health care provider.  Document Released: 10/30/2004 Document Revised: 08/27/2014 Document Reviewed: 04/08/2014 Elsevier Interactive Patient Education [employer].

## 2015-10-30 NOTE — Telephone Encounter (Signed)
There was a call from the hospital after Nicholas Vaughan's surgery.  They had Nicholas Vaughan listed as having a return appointment with Nicholas Vaughan on the 20th.  Does he need to wait until Nicholas Vaughan is back on the 20th or see Nicholas Vaughan on the week of the 10th?  Also, there was a question regarding therapy and when/where Nicholas Vaughan was to begin therapy.

## 2015-10-30 NOTE — Anesthesia Postprocedure Evaluation (Signed)
Anesthesia Post Note  Patient: Nicholas Vaughan  Procedure(s) Performed: Procedure(s) (LRB): RIGHT KNEE ARTHROSCOPY WITH LATERAL AND MEDIAL MENISECTOMY (Right)  Patient location during evaluation: PACU Anesthesia Type: General Level of consciousness: awake and alert Pain management: pain level controlled Vital Signs Assessment: post-procedure vital signs reviewed and stable Respiratory status: spontaneous breathing Cardiovascular status: stable Anesthetic complications: no    Last Vitals:  Filed Vitals:   10/30/15 0953 10/30/15 1000  BP: 116/86 112/74  Pulse: 72 69  Temp: 36.8 C   Resp: 15 11    Last Pain:  Filed Vitals:   10/30/15 1013  PainSc: 0-No pain                 Shwanda Soltis

## 2015-10-30 NOTE — H&P (Signed)
Chief Complaint   Patient presents with   .  Knee Pain       Right knee pain    Last seen October 2020 thousand 15 HPI Nicholas Vaughan is a 65 y.o. male.  This gentleman has pain in his right knee radiates down to his right foot. He has posterior pain with a Baker's cyst. I saw him before you lateral meniscus and osteoarthritis. He was lost to follow-up  He complains planes now on repeat presentation with right knee pain dull throbbing aching unrelieved by Advil moderate in severity present now for about 6 years worse with exercise and walking. Visual giving way symptoms.  Review of Systems Review of Systems  HENT: Positive for dental problem and sinus pressure.   Musculoskeletal: Positive for arthralgias.  Allergic/Immunologic: Positive for environmental allergies.  Neurological:        Burning pain in his legs      Past Medical History   Diagnosis  Date   .  Hypertension     .  GERD (gastroesophageal reflux disease)     .  High cholesterol         Past Surgical History   Procedure  Laterality  Date   .  Ankle surgery       .  Prostate surgery           2013 Prostate cancer       Family History   Problem  Relation  Age of Onset   .  Diabetes  Maternal Aunt       Social History Social History   Substance Use Topics   .  Smoking status:  Current Every Day Smoker   .  Smokeless tobacco:  Not on file   .  Alcohol Use:  0.0 oz/week       0 Standard drinks or equivalent per week         Comment: occasional once a week       Allergies   Allergen  Reactions   .  Sulfa Antibiotics  Itching       Current Outpatient Prescriptions   Medication  Sig  Dispense  Refill   .  aspirin EC 81 MG tablet  Take 81 mg by mouth daily.         Marland Kitchen  atenolol (TENORMIN) 25 MG tablet  Take 25 mg by mouth daily.       Marland Kitchen  atorvastatin (LIPITOR) 40 MG tablet  Take 40 mg by mouth daily.       .  cetirizine (ZYRTEC) 10 MG tablet  Take 10 mg by mouth daily.       .  metFORMIN  (GLUCOPHAGE) 1000 MG tablet  Take 1,000 mg by mouth 2 (two) times daily with a meal.       .  Multiple Vitamin (MULTI VITAMIN MENS) tablet  Take 1 tablet by mouth daily.         Marland Kitchen  omeprazole (PRILOSEC) 20 MG capsule  Take 20 mg by mouth daily.       Marland Kitchen  senna-docusate (SENOKOT-S) 8.6-50 MG tablet  Take 1 tablet by mouth daily.       .  traMADol-acetaminophen (ULTRACET) 37.5-325 MG tablet  Take 1 tablet by mouth every 6 (six) hours as needed.  84 tablet  0      No current facility-administered medications for this visit.        Physical Exam Physical Exam Height 5\' 7"  (1.702 m), weight 193  lb (87.544 kg). Appearance, there are no abnormalities in terms of appearance the patient was well-developed and well-nourished. The grooming and hygiene were normal.  Mental status orientation, there was normal alertness and orientation Mood pleasant Ambulatory status normal with no assistive devices Upper extremities are normal Examination of the right knee Inspection  lateral joint line tenderness , no effusion. Range of motion 115 Tests for stability normal Motor strength  5 out of 5 quadriceps strength no atrophy no tremors  Positive McMurray sign for lateral meniscus tear   Skin warm dry and intact without laceration or ulceration or erythema Neurologic examination normal sensation Vascular examination normal pulses with warm extremity and normal capillary refill  The opposite knee reveals no swelling or effusion, full range of motion, normal muscle tone, anterior and posterior drawer test stable neurovascular exam intact  Data Reviewed X-rays today show arthritis in his knee previous MRI showed torn lateral meniscus  Assessment   torn lateral meniscus right knee knee   Plan Arthroscopy right knee lateral menisectomy

## 2015-10-30 NOTE — Op Note (Signed)
10/30/2015  9:36 AM  PATIENT:  Nicholas Vaughan  65 y.o. male  PRE-OPERATIVE DIAGNOSIS:  RIGHT LATERAL MENISCUS TEAR  POST-OPERATIVE DIAGNOSIS:  LATERAL AND MEDIAL MENISCAL TEAR,ARTHRITIS RIGHT KNEE  PROCEDURE:  Procedure(s): RIGHT KNEE ARTHROSCOPY WITH LATERAL AND MEDIAL MENISECTOMY (Right)  SURGEON:  Surgeon(s) and Role:    * Carole Civil, MD - Primary  PHYSICIAN ASSISTANT:   ASSISTANTS: none   ANESTHESIA:   general  EBL:  Total I/O In: 1200 [I.V.:1200] Out: 0   BLOOD ADMINISTERED:none  DRAINS: none   LOCAL MEDICATIONS USED:  MARCAINE     SPECIMEN:  No Specimen  DISPOSITION OF SPECIMEN:  N/A  COUNTS:  YES  TOURNIQUET:    DICTATION: .Dragon Dictation  PLAN OF CARE: Discharge to home after PACU  PATIENT DISPOSITION:  PACU - hemodynamically stable.   Delay start of Pharmacological VTE agent (>24hrs) due to surgical blood loss or risk of bleeding: yes  Knee arthroscopy dictation  The patient was identified in the preoperative holding area using 2 approved identification mechanisms. The chart was reviewed and updated. The surgical site was confirmed as RIGHT knee and marked with an indelible marker.  The patient was taken to the operating room for anesthesia. After successful  GENERAL anesthesia, 2G ANCEF was used as IV antibiotics.  The patient was placed in the supine position with the (RIGHT  LEG) the operative extremity in an arthroscopic leg holder and the opposite extremity in a padded leg holder.  The timeout was executed.  A lateral portal was established with an 11 blade and the scope was introduced into the joint. A diagnostic arthroscopy was performed in circumferential manner examining the entire knee joint. A medial portal was established and the diagnostic arthroscopy was repeated using a probe to palpate intra-articular structures as they were encountered.   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 He was extremely difficult to get to the medial meniscus. The arthroscopic leg holder was adjusted which improved visualization slightly.  I resected the meniscus as best I could and balanced it with the arthroscopic wand.  The lateral meniscus was much more easy to get to there were multiple tears in the posterior horn body and anterior horn these were resected with a shaver, duckbill forceps, and ArthroCare wand was used to balance the meniscus until a stable rim was obtained  The arthroscopic pump was placed on the wash mode and any excess debris was removed from the joint using suction.  60 cc of Marcaine with epinephrine was injected through the arthroscope.  The portals were closed with 3-0 nylon suture.  A sterile bandage, Ace wrap and Cryo/Cuff was placed and the Cryo/Cuff was activated. The patient was taken to the recovery room in stable condition.

## 2015-10-30 NOTE — Brief Op Note (Signed)
10/30/2015  9:36 AM  PATIENT:  Nicholas Vaughan  65 y.o. male  PRE-OPERATIVE DIAGNOSIS:  RIGHT LATERAL MENISCUS TEAR  POST-OPERATIVE DIAGNOSIS:  LATERAL AND MEDIAL MENISCAL TEAR,ARTHRITIS RIGHT KNEE  PROCEDURE:  Procedure(s): RIGHT KNEE ARTHROSCOPY WITH LATERAL AND MEDIAL MENISECTOMY (Right)  SURGEON:  Surgeon(s) and Role:    * Carole Civil, MD - Primary  PHYSICIAN ASSISTANT:   ASSISTANTS: none   ANESTHESIA:   general  EBL:  Total I/O In: 1200 [I.V.:1200] Out: 0   BLOOD ADMINISTERED:none  DRAINS: none   LOCAL MEDICATIONS USED:  MARCAINE     SPECIMEN:  No Specimen  DISPOSITION OF SPECIMEN:  N/A  COUNTS:  YES  TOURNIQUET:    DICTATION: .Dragon Dictation  PLAN OF CARE: Discharge to home after PACU  PATIENT DISPOSITION:  PACU - hemodynamically stable.   Delay start of Pharmacological VTE agent (>24hrs) due to surgical blood loss or risk of bleeding: yes  Knee arthroscopy dictation  The patient was identified in the preoperative holding area using 2 approved identification mechanisms. The chart was reviewed and updated. The surgical site was confirmed as RIGHT knee and marked with an indelible marker.  The patient was taken to the operating room for anesthesia. After successful  GENERAL anesthesia, 2G ANCEF was used as IV antibiotics.  The patient was placed in the supine position with the (RIGHT  LEG) the operative extremity in an arthroscopic leg holder and the opposite extremity in a padded leg holder.  The timeout was executed.  A lateral portal was established with an 11 blade and the scope was introduced into the joint. A diagnostic arthroscopy was performed in circumferential manner examining the entire knee joint. A medial portal was established and the diagnostic arthroscopy was repeated using a probe to palpate intra-articular structures as they were encountered.   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 He was extremely difficult to get to the medial meniscus. The arthroscopic leg holder was adjusted which improved visualization slightly.  I resected the meniscus as best I could and balanced it with the arthroscopic wand.  The lateral meniscus was much more easy to get to there were multiple tears in the posterior horn body and anterior horn these were resected with a shaver, duckbill forceps, and ArthroCare wand was used to balance the meniscus until a stable rim was obtained  The arthroscopic pump was placed on the wash mode and any excess debris was removed from the joint using suction.  60 cc of Marcaine with epinephrine was injected through the arthroscope.  The portals were closed with 3-0 nylon suture.  A sterile bandage, Ace wrap and Cryo/Cuff was placed and the Cryo/Cuff was activated. The patient was taken to the recovery room in stable condition.

## 2015-10-30 NOTE — Interval H&P Note (Signed)
History and Physical Interval Note:  10/30/2015 8:07 AM  Nicholas Vaughan  has presented today for surgery, with the diagnosis of RIGHT LATERAL MENISCUS TEAR  The various methods of treatment have been discussed with the patient and family. After consideration of risks, benefits and other options for treatment, the patient has consented to  Procedure(s) with comments: KNEE ARTHROSCOPY WITH LATERAL MENISECTOMY (Right) - Pt will need crutch training as a surgical intervention .  The patient's history has been reviewed, patient examined, no change in status, stable for surgery.  I have reviewed the patient's chart and labs.  Questions were answered to the patient's satisfaction.     Arther Abbott

## 2015-10-30 NOTE — Telephone Encounter (Signed)
Message  Received: Today  10/30/15 Message  Received: Today    Carole Civil, MD  Uval[facility] Bristle         Change appt to 7/20    Done. Patient aware.

## 2015-10-30 NOTE — Transfer of Care (Signed)
Immediate Anesthesia Transfer of Care Note  Patient: Nicholas Vaughan  Procedure(s) Performed: Procedure(s): RIGHT KNEE ARTHROSCOPY WITH LATERAL AND MEDIAL MENISECTOMY (Right)  Patient Location: PACU  Anesthesia Type:General  Level of Consciousness: sedated and patient cooperative  Airway & Oxygen Therapy: Patient Spontanous Breathing and non-rebreather face mask  Post-op Assessment: Report given to RN, Post -op Vital signs reviewed and stable and Patient moving all extremities  Post vital signs: Reviewed and stable    Last Pain:  Filed Vitals:   10/30/15 0806  PainSc: 6       Patients Stated Pain Goal: 9 ([id number])  Complications: No apparent anesthesia complications

## 2015-11-06 ENCOUNTER — Ambulatory Visit: Payer: Medicare HMO | Admitting: Orthopaedic Surgery

## 2015-11-06 ENCOUNTER — Encounter (HOSPITAL_COMMUNITY): Payer: Self-pay | Admitting: Orthopedic Surgery

## 2015-11-13 ENCOUNTER — Encounter: Payer: Self-pay | Admitting: Orthopedic Surgery

## 2015-11-13 ENCOUNTER — Ambulatory Visit: Payer: Medicare HMO | Admitting: Orthopedic Surgery

## 2015-11-13 VITALS — BP 140/84 | HR 89 | Ht 66.0 in | Wt 182.0 lb

## 2015-11-13 DIAGNOSIS — Z9889 Other specified postprocedural states: Secondary | ICD-10-CM

## 2015-11-13 MED ORDER — ACETAMINOPHEN-CODEINE #4 300-60 MG PO TABS: 1.0000 | ORAL_TABLET | ORAL | Status: AC | PRN

## 2015-11-13 NOTE — Progress Notes (Signed)
Postop visit #1 status post arthroscopy of the right knee  He had exposed bone osteoarthritis poor prognosis  He says his hydrocodone 7.5 mg not controlling his pain  His knee has an effusion he has full extension passive motion 90 of flexion  Recommend start physical therapy come back in 4 weeks of be out of work for the next 4 weeks

## 2015-11-13 NOTE — Patient Instructions (Addendum)
Call [facility] in [place] to schedule therapy visits Address: [address], [place], [place] [address]  Phone: [phone]  OUT OF WORK 4 WEEKS

## 2015-12-11 ENCOUNTER — Encounter: Payer: Self-pay | Admitting: Orthopedic Surgery

## 2015-12-11 ENCOUNTER — Ambulatory Visit (INDEPENDENT_AMBULATORY_CARE_PROVIDER_SITE_OTHER): Payer: Medicare HMO | Admitting: Orthopedic Surgery

## 2015-12-11 VITALS — BP 125/80 | HR 77 | Ht 68.0 in | Wt 180.0 lb

## 2015-12-11 DIAGNOSIS — Z9889 Other specified postprocedural states: Secondary | ICD-10-CM

## 2015-12-11 DIAGNOSIS — Z4789 Encounter for other orthopedic aftercare: Secondary | ICD-10-CM

## 2015-12-11 DIAGNOSIS — S83281D Other tear of lateral meniscus, current injury, right knee, subsequent encounter: Secondary | ICD-10-CM

## 2015-12-11 NOTE — Progress Notes (Signed)
Patient ID: Nicholas Vaughan, male   DOB: 02/10/1951, 65 y.o.   MRN: [id number]  Follow up visit  Chief Complaint  Patient presents with  . Follow-up    SARK, DOS 10/30/15    BP 125/80   Pulse 77   Ht 5\' 8"  (1.727 m)   Wt 180 lb (81.6 kg)   BMI 27.37 kg/m   Encounter Diagnoses  Name Primary?  . Lateral meniscus tear, current, right, subsequent encounter   . S/P right knee arthroscopy   . Surgical aftercare, musculoskeletal system Yes   6 weeks post op   Right Knee Exam   Tenderness  The patient is experiencing tenderness in the lateral joint line.  Range of Motion  Extension: normal  Flexion: 110   Muscle Strength   The patient has normal right knee strength.    MILD LIMP BUT EAGER TO RETURN TO WORK   2 MORE WEKS OF THERAPY  SEE ME AS NEEDED  RTW TOMORROW NO RESTRICTIONS    3:01 PM Arther Abbott, MD 12/11/2015

## 2015-12-11 NOTE — Patient Instructions (Signed)
RTW TOMORROW NO RESTRICTIONS

## 2016-07-05 ENCOUNTER — Ambulatory Visit (INDEPENDENT_AMBULATORY_CARE_PROVIDER_SITE_OTHER): Payer: Medicare HMO | Admitting: Otolaryngology

## 2016-08-05 ENCOUNTER — Ambulatory Visit (INDEPENDENT_AMBULATORY_CARE_PROVIDER_SITE_OTHER): Payer: Medicare HMO | Admitting: Otolaryngology

## 2016-08-05 DIAGNOSIS — R1312 Dysphagia, oropharyngeal phase: Secondary | ICD-10-CM | POA: Diagnosis not present

## 2016-09-14 ENCOUNTER — Ambulatory Visit (INDEPENDENT_AMBULATORY_CARE_PROVIDER_SITE_OTHER): Payer: Medicare HMO | Admitting: Urology

## 2016-09-14 ENCOUNTER — Other Ambulatory Visit (HOSPITAL_COMMUNITY)
Admission: RE | Admit: 2016-09-14 | Discharge: 2016-09-14 | Disposition: A | Discharge status: Home / Self Care | Payer: Medicare HMO | Source: Other Acute Inpatient Hospital | Attending: Urology | Admitting: Urology

## 2016-09-14 DIAGNOSIS — C61 Malignant neoplasm of prostate: Secondary | ICD-10-CM | POA: Diagnosis not present

## 2016-09-14 DIAGNOSIS — R300 Dysuria: Secondary | ICD-10-CM | POA: Insufficient documentation

## 2016-09-17 LAB — URINE CULTURE: Culture: 40000 — AB

## 2016-10-05 ENCOUNTER — Encounter (INDEPENDENT_AMBULATORY_CARE_PROVIDER_SITE_OTHER): Payer: Self-pay

## 2016-10-05 ENCOUNTER — Encounter (INDEPENDENT_AMBULATORY_CARE_PROVIDER_SITE_OTHER): Payer: Self-pay | Admitting: Internal Medicine

## 2016-10-18 ENCOUNTER — Encounter (INDEPENDENT_AMBULATORY_CARE_PROVIDER_SITE_OTHER): Payer: Self-pay | Admitting: Internal Medicine

## 2016-10-18 ENCOUNTER — Ambulatory Visit (INDEPENDENT_AMBULATORY_CARE_PROVIDER_SITE_OTHER): Payer: Medicare HMO | Admitting: Internal Medicine

## 2016-10-18 VITALS — BP 122/80 | HR 60 | Temp 98.4°F | Ht 67.0 in | Wt 188.0 lb

## 2016-10-18 DIAGNOSIS — K219 Gastro-esophageal reflux disease without esophagitis: Secondary | ICD-10-CM

## 2016-10-18 NOTE — Progress Notes (Signed)
   Subjective:    Patient ID: Nicholas Vaughan, male    DOB: Oct 06, 1950, 66 y.o.   MRN: [id number]  HPI  Here today for f/u. Hxo f GERD. He tells me he is doing good. Sometimes he has constipation. Acid reflux controlled with Omeprazole.  Appetite is good. No weight loss. He has a BM daily. He says sometimes he has to take a stool softener, Eats 3-4  meals a day. Hx of diabetes x 8 yrs. BS run from 100-120.   Wt  In June of 2016 188     He last colonoscopy was at age 8 in [place] and it was normal. He had 3 polyps. He says his next colonoscopy will be when he turns 69.    Review of Systems Past Medical History:  Diagnosis Date  . GERD (gastroesophageal reflux disease)   . High cholesterol   . Hypertension     Past Surgical History:  Procedure Laterality Date  . ANKLE SURGERY    . KNEE ARTHROSCOPY WITH LATERAL MENISECTOMY Right 10/30/2015   Procedure: RIGHT KNEE ARTHROSCOPY WITH LATERAL AND MEDIAL MENISECTOMY;  Surgeon: Carole Civil, MD;  Location: [facility];  Service: Orthopedics;  Laterality: Right;  . PROSTATE SURGERY     2013 Prostate cancer  . THROAT SURGERY      Allergies  Allergen Reactions  . Sulfa Antibiotics Itching    Current Outpatient Prescriptions on File Prior to Visit  Medication Sig Dispense Refill  . aspirin EC 81 MG tablet Take 81 mg by mouth daily.      Marland Kitchen atenolol (TENORMIN) 25 MG tablet Take 25 mg by mouth daily.    Marland Kitchen atorvastatin (LIPITOR) 40 MG tablet Take 80 mg by mouth daily.     . cetirizine (ZYRTEC) 10 MG tablet Take 10 mg by mouth daily.    . metFORMIN (GLUCOPHAGE) 1000 MG tablet Take 1,000 mg by mouth 2 (two) times daily with a meal.    . omeprazole (PRILOSEC) 20 MG capsule Take 40 mg by mouth daily.     Marland Kitchen senna-docusate (SENOKOT-S) 8.6-50 MG tablet Take 1 tablet by mouth daily.    Marland Kitchen acetaminophen-codeine (TYLENOL #4) 300-60 MG tablet Take 1 tablet by mouth every 4 (four) hours as needed for moderate pain. 42 tablet 0  .  Multiple Vitamin (MULTI VITAMIN MENS) tablet Take 1 tablet by mouth daily. Reported on 10/16/2015    . promethazine (PHENERGAN) 12.5 MG tablet Take 1 tablet (12.5 mg total) by mouth every 6 (six) hours as needed for nausea or vomiting. 30 tablet 0   No current facility-administered medications on file prior to visit.         Objective:   Physical Exam Blood pressure 122/80, pulse 60, temperature 98.4 F (36.9 C), height 5\' 7"  (1.702 m), weight 188 lb (85.3 kg). Alert and oriented. Skin warm and dry. Oral mucosa is moist.   . Sclera anicteric, conjunctivae is pink. Thyroid not enlarged. No cervical lymphadenopathy. Lungs clear. Heart regular rate and rhythm.  Abdomen is soft. Bowel sounds are positive. No hepatomegaly. No abdominal masses felt. No tenderness.  No edema to lower extremities.           Assessment & Plan:  GERD. Continue the Omeprazole. OV in 1 year.

## 2016-10-18 NOTE — Patient Instructions (Signed)
OV in 1 year.  

## 2017-02-04 ENCOUNTER — Emergency Department (HOSPITAL_COMMUNITY)
Admission: EM | Admit: 2017-02-04 | Discharge: 2017-02-04 | Disposition: A | Discharge status: Home / Self Care | Payer: Medicare HMO | Attending: Emergency Medicine | Admitting: Emergency Medicine

## 2017-02-04 ENCOUNTER — Encounter (HOSPITAL_COMMUNITY): Payer: Self-pay | Admitting: Emergency Medicine

## 2017-02-04 ENCOUNTER — Emergency Department (HOSPITAL_COMMUNITY): Payer: Medicare HMO

## 2017-02-04 DIAGNOSIS — R600 Localized edema: Secondary | ICD-10-CM | POA: Diagnosis not present

## 2017-02-04 DIAGNOSIS — Z7984 Long term (current) use of oral hypoglycemic drugs: Secondary | ICD-10-CM | POA: Insufficient documentation

## 2017-02-04 DIAGNOSIS — Z7982 Long term (current) use of aspirin: Secondary | ICD-10-CM | POA: Diagnosis not present

## 2017-02-04 DIAGNOSIS — Z79899 Other long term (current) drug therapy: Secondary | ICD-10-CM | POA: Insufficient documentation

## 2017-02-04 DIAGNOSIS — E119 Type 2 diabetes mellitus without complications: Secondary | ICD-10-CM | POA: Diagnosis not present

## 2017-02-04 DIAGNOSIS — Z87891 Personal history of nicotine dependence: Secondary | ICD-10-CM | POA: Insufficient documentation

## 2017-02-04 DIAGNOSIS — Z8546 Personal history of malignant neoplasm of prostate: Secondary | ICD-10-CM | POA: Diagnosis not present

## 2017-02-04 DIAGNOSIS — I10 Essential (primary) hypertension: Secondary | ICD-10-CM | POA: Diagnosis not present

## 2017-02-04 DIAGNOSIS — R2243 Localized swelling, mass and lump, lower limb, bilateral: Secondary | ICD-10-CM | POA: Diagnosis present

## 2017-02-04 DIAGNOSIS — R609 Edema, unspecified: Secondary | ICD-10-CM

## 2017-02-04 HISTORY — DX: Malignant (primary) neoplasm, unspecified: C80.1

## 2017-02-04 HISTORY — DX: Type 2 diabetes mellitus without complications: E11.9

## 2017-02-04 LAB — COMPREHENSIVE METABOLIC PANEL
ALBUMIN: 3.8 g/dL (ref 3.5–5.0)
ALK PHOS: 43 U/L (ref 38–126)
ALT: 27 U/L (ref 17–63)
ANION GAP: 10 (ref 5–15)
AST: 26 U/L (ref 15–41)
BUN: 16 mg/dL (ref 6–20)
CALCIUM: 9 mg/dL (ref 8.9–10.3)
CHLORIDE: 104 mmol/L (ref 101–111)
CO2: 28 mmol/L (ref 22–32)
Creatinine, Ser: 0.99 mg/dL (ref 0.61–1.24)
GFR calc non Af Amer: >60 mL/min (ref >60)
Glucose, Bld: 92 mg/dL (ref 65–99)
POTASSIUM: 4 mmol/L (ref 3.5–5.1)
SODIUM: 142 mmol/L (ref 135–145)
Total Bilirubin: 0.8 mg/dL (ref 0.3–1.2)
Total Protein: 6.8 g/dL (ref 6.5–8.1)

## 2017-02-04 LAB — CBC
HCT: 38 % — ABNORMAL LOW (ref 39.0–52.0)
Hemoglobin: 12.3 g/dL — ABNORMAL LOW (ref 13.0–17.0)
MCH: 30.2 pg (ref 26.0–34.0)
MCHC: 32.4 g/dL (ref 30.0–36.0)
MCV: 93.4 fL (ref 78.0–100.0)
PLATELETS: 281 10*3/uL (ref 150–400)
RBC: 4.07 MIL/uL — ABNORMAL LOW (ref 4.22–5.81)
RDW: 13.6 % (ref 11.5–15.5)
WBC: 3.8 10*3/uL — ABNORMAL LOW (ref 4.0–10.5)

## 2017-02-04 LAB — BRAIN NATRIURETIC PEPTIDE: B Natriuretic Peptide: 42 pg/mL (ref 0.0–100.0)

## 2017-02-04 NOTE — ED Notes (Signed)
Pt transported to [facility]

## 2017-02-04 NOTE — Discharge Instructions (Signed)
See your doctor in next 3 days

## 2017-02-04 NOTE — ED Provider Notes (Signed)
[facility] DEPT Provider Note   CSN: [id number] Arrival date & time: 02/04/17  0901     History   Chief Complaint Chief Complaint  Patient presents with  . Leg Swelling    HPI Nicholas Vaughan is a 66 y.o. male.  HPI  History of diabetes, hypertension and prostate cancer Patient states that over the last several days he has had some recurrent swelling in his lower extremities at the ankles mostly on the right side. This seems to get worse during the day, seems to get better at night. There is no associated coughing shortness of breath or swelling above the knees. He reports that last night his right ankle was very swollen but this morning it looks much better and is almost back to normal. He denies any pain in his feet, denies any coughing or shortness of breath, denies any history of congestive heart failure or liver injury.  Past Medical History:  Diagnosis Date  . Cancer [facility])    prostate 2013  . Diabetes mellitus without complication ([place])   . GERD (gastroesophageal reflux disease)   . High cholesterol   . Hypertension     Patient Active Problem List   Diagnosis Date Noted  . Medial meniscus, posterior horn derangement   . Lateral meniscus tear, current   . Primary osteoarthritis of right knee   . GERD (gastroesophageal reflux disease) 07/16/2015  . High cholesterol 07/16/2015  . Essential hypertension 07/16/2015  . Diabetes ([place]) 07/16/2015    Past Surgical History:  Procedure Laterality Date  . ANKLE SURGERY    . KNEE ARTHROSCOPY WITH LATERAL MENISECTOMY Right 10/30/2015   Procedure: RIGHT KNEE ARTHROSCOPY WITH LATERAL AND MEDIAL MENISECTOMY;  Surgeon: Carole Civil, MD;  Location: [facility];  Service: Orthopedics;  Laterality: Right;  . PROSTATE SURGERY     2013 Prostate cancer  . THROAT SURGERY         Home Medications    Prior to Admission medications   Medication Sig Start Date End Date Taking? Authorizing Provider  acetaminophen-codeine  (TYLENOL #4) 300-60 MG tablet Take 1 tablet by mouth every 4 (four) hours as needed for moderate pain. 11/13/15   Carole Civil, MD  aspirin EC 81 MG tablet Take 81 mg by mouth daily.      [provider]  atenolol (TENORMIN) 25 MG tablet Take 25 mg by mouth daily.    [provider]  atorvastatin (LIPITOR) 40 MG tablet Take 80 mg by mouth daily.     [provider]  cetirizine (ZYRTEC) 10 MG tablet Take 10 mg by mouth daily.    [provider]  metFORMIN (GLUCOPHAGE) 1000 MG tablet Take 1,000 mg by mouth 2 (two) times daily with a meal.    [provider]  Multiple Vitamin (MULTI VITAMIN MENS) tablet Take 1 tablet by mouth daily. Reported on 10/16/2015    [provider]  omeprazole (PRILOSEC) 20 MG capsule Take 40 mg by mouth daily.     [provider]  promethazine (PHENERGAN) 12.5 MG tablet Take 1 tablet (12.5 mg total) by mouth every 6 (six) hours as needed for nausea or vomiting. 10/30/15   Carole Civil, MD  senna-docusate (SENOKOT-S) 8.6-50 MG tablet Take 1 tablet by mouth daily.    [provider]    Family History Family History  Problem Relation Age of Onset  . Diabetes Maternal Aunt     Social History Social History  Substance Use Topics  . Smoking status:  Former Smoker    Packs/day: 0.50    Years: 20.00    Quit date: 10/23/2010  . Smokeless tobacco: Never Used  . Alcohol use 0.0 oz/week     Comment: occasional once a week     Allergies   Sulfa antibiotics   Review of Systems Review of Systems  All other systems reviewed and are negative.    Physical Exam Updated Vital Signs BP (!) 167/94   Pulse 76   Temp 97.9 F (36.6 C)   Resp 18   Ht 5\' 7"  (1.702 m)   Wt 83.9 kg (185 lb)   SpO2 97%   BMI 28.98 kg/m   Physical Exam  Constitutional: He appears well-developed and well-nourished. No distress.  HENT:  Head: Normocephalic and atraumatic.  Mouth/Throat: Oropharynx is clear  and moist. No oropharyngeal exudate.  Eyes: Pupils are equal, round, and reactive to light. Conjunctivae and EOM are normal. Right eye exhibits no discharge. Left eye exhibits no discharge. No scleral icterus.  Neck: Normal range of motion. Neck supple. No JVD present. No thyromegaly present.  Cardiovascular: Normal rate, regular rhythm, normal heart sounds and intact distal pulses.  Exam reveals no gallop and no friction rub.   No murmur heard. Pulmonary/Chest: Effort normal and breath sounds normal. No respiratory distress. He has no wheezes. He has no rales.  Abdominal: Soft. Bowel sounds are normal. He exhibits no distension and no mass. There is no tenderness.  Musculoskeletal: Normal range of motion. He exhibits no edema or tenderness.  There is slight asymmetry of the lower extremities with the right calf being larger than the left. The ankles bilaterally appear normal without any edema, there is no edema in the lower extremities at all. Pulses at the dorsalis pedis and posterior tibial pulses are normal. Normal range of motion of the ankle joints bilaterally.  Lymphadenopathy:    He has no cervical adenopathy.  Neurological: He is alert. Coordination normal.  Skin: Skin is warm and dry. No rash noted. No erythema.  Psychiatric: He has a normal mood and affect. His behavior is normal.  Nursing note and vitals reviewed.    ED Treatments / Results  Labs (all labs ordered are listed, but only abnormal results are displayed) Labs Reviewed  CBC - Abnormal; Notable for the following:       Result Value   WBC 3.8 (*)    RBC 4.07 (*)    Hemoglobin 12.3 (*)    HCT 38.0 (*)    All other components within normal limits  COMPREHENSIVE METABOLIC PANEL  BRAIN NATRIURETIC PEPTIDE    EKG  EKG Interpretation None       Radiology US Venous Img Lower Unilateral Right  Result Date: 02/04/2017 CLINICAL DATA:  Right lower extremity swelling from the calf to the foot over the past 3 days.  Pain in the ankle. History prostate cancer. EXAM: Right LOWER EXTREMITY VENOUS DOPPLER ULTRASOUND TECHNIQUE: Gray-scale sonography with graded compression, as well as color Doppler and duplex ultrasound were performed to evaluate the lower extremity deep venous systems from the level of the common femoral vein and including the common femoral, femoral, profunda femoral, popliteal and calf veins including the posterior tibial, peroneal and gastrocnemius veins when visible. The superficial great saphenous vein was also interrogated. Spectral Doppler was utilized to evaluate flow at rest and with distal augmentation maneuvers in the common femoral, femoral and popliteal veins. COMPARISON:  None. FINDINGS: Contralateral Common Femoral Vein: Respiratory phasicity is normal and symmetric with  the symptomatic side. No evidence of thrombus. Normal compressibility. Common Femoral Vein: No evidence of thrombus. Normal compressibility, respiratory phasicity and response to augmentation. Saphenofemoral Junction: No evidence of thrombus. Normal compressibility and flow on color Doppler imaging. Profunda Femoral Vein: No evidence of thrombus. Normal compressibility and flow on color Doppler imaging. Femoral Vein: No evidence of thrombus. Normal compressibility, respiratory phasicity and response to augmentation. Popliteal Vein: No evidence of thrombus. Normal compressibility, respiratory phasicity and response to augmentation. Calf Veins: No evidence of thrombus. Normal compressibility and flow on color Doppler imaging. Superficial Great Saphenous Vein: No evidence of thrombus. Normal compressibility. Venous Reflux:  None. Other Findings:  None. IMPRESSION: No evidence of right lower extremity deep venous thrombosis. Electronically Signed   By: Van Clines M.D.   On: 02/04/2017 10:25    Procedures Procedures (including critical care time)  Medications Ordered in ED Medications - No data to display   Initial  Impression / Assessment and Plan / ED Course  I have reviewed the triage vital signs and the nursing notes.  Pertinent labs & imaging results that were available during my care of the patient were reviewed by me and considered in my medical decision making (see chart for details).     The patient likely has benign positional edema, states that he is on his feet chasing his grandson around most of the day, he has no significant edema at this time though there is some asymmetry and he does comment on some pain in the right posterior calf. Ultrasound, labs, anticipate discharge. He is well-appearing otherwise with no other signs of congestive heart failure or liver failure. He does not appear to have nephrotic syndrome clinically  Labs normal [place] neg for DVT Stable for d;c.  Final Clinical Impressions(s) / ED Diagnoses   Final diagnoses:  Peripheral edema    New Prescriptions New Prescriptions   No medications on file     Noemi Chapel, MD 02/04/17 1213

## 2017-02-04 NOTE — ED Notes (Signed)
Returned from U/S

## 2017-02-04 NOTE — ED Triage Notes (Signed)
Pt c/o bilateral foot and right lower leg swelling x 3 days.

## 2017-03-06 IMAGING — CT CT ABD-PELV W/ CM
2 of 5 series · 16 of 46 positions shown, 18 images · IV contrast (Omnipaque 300)
Comparison: None.

CLINICAL DATA: Epigastric pain

EXAM:
CT ABDOMEN AND PELVIS WITH CONTRAST
TECHNIQUE: Multidetector CT imaging of the abdomen and pelvis was performed
using the standard protocol following bolus administration of
intravenous contrast.
CONTRAST:  100mL OMNIPAQUE IOHEXOL 300 MG/ML  SOLN

[Series 2: abd_pel_with 5.0 b40f · axial · 0.76mm/px · z∈[+482,+882]mm · 13 of 90 slices shown, 15 images]
[im 5/90  soft-tissue]
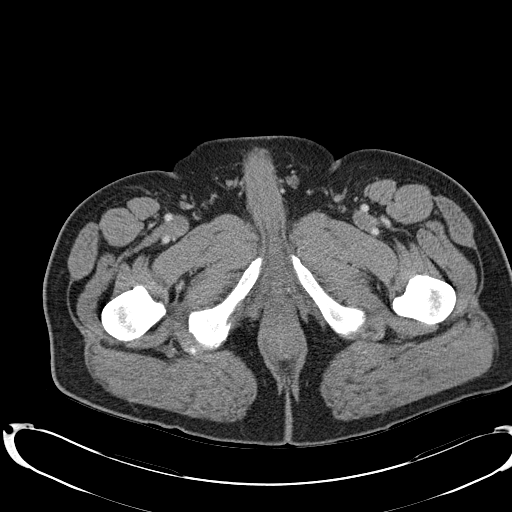
[im 5/90  bone]
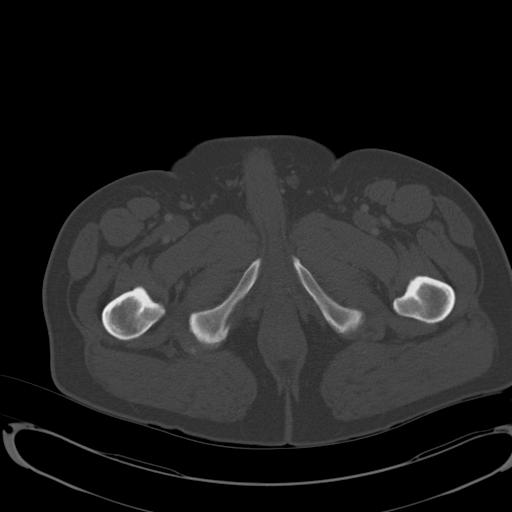
[im 10/90  soft-tissue]
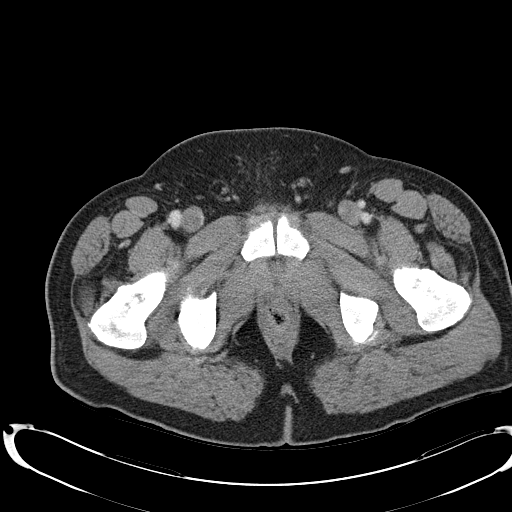
[im 20/90  soft-tissue]
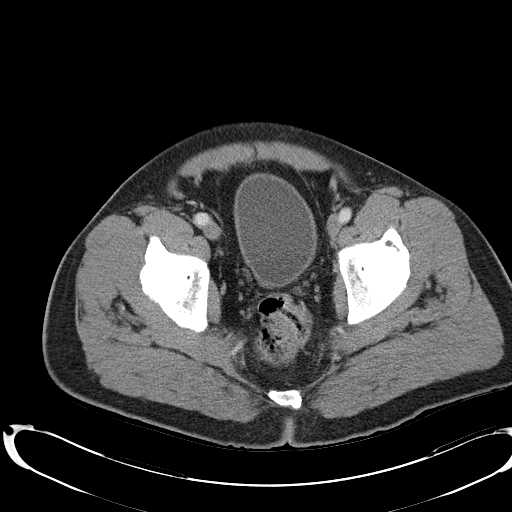
[im 25/90  soft-tissue]
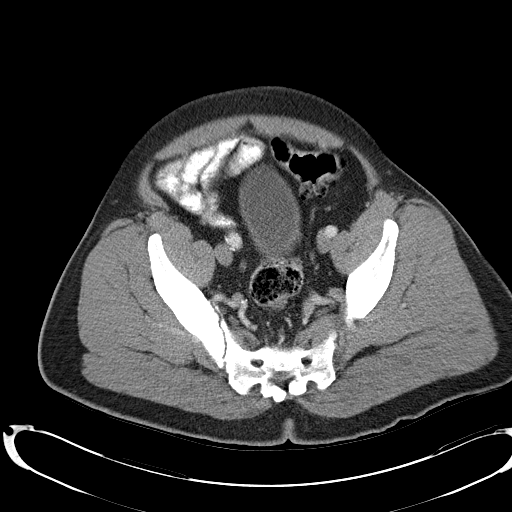
[im 30/90  soft-tissue]
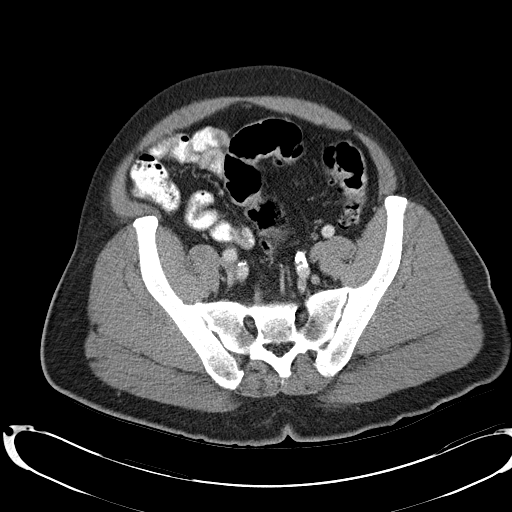
[im 40/90  soft-tissue]
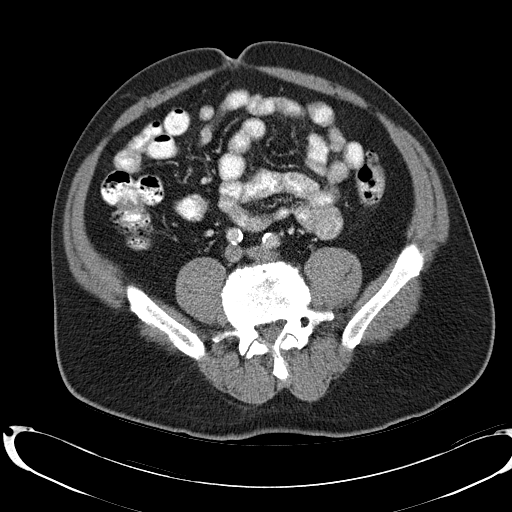
[im 45/90  soft-tissue]
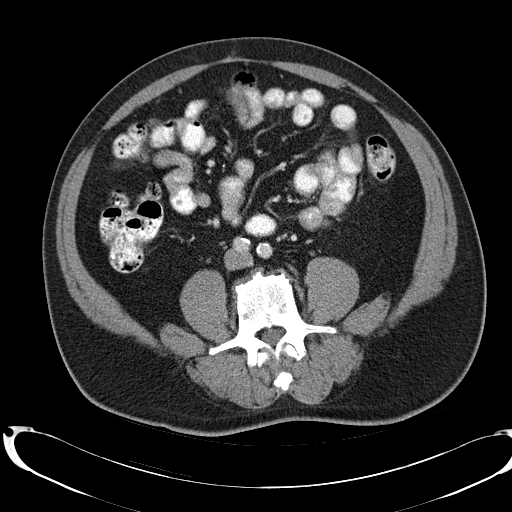
[im 50/90  soft-tissue]
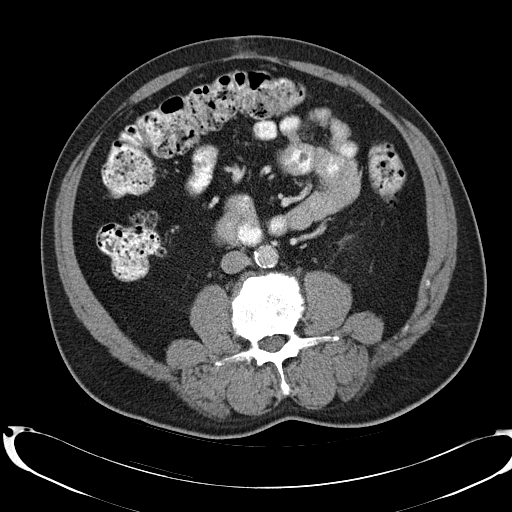
[im 60/90  soft-tissue]
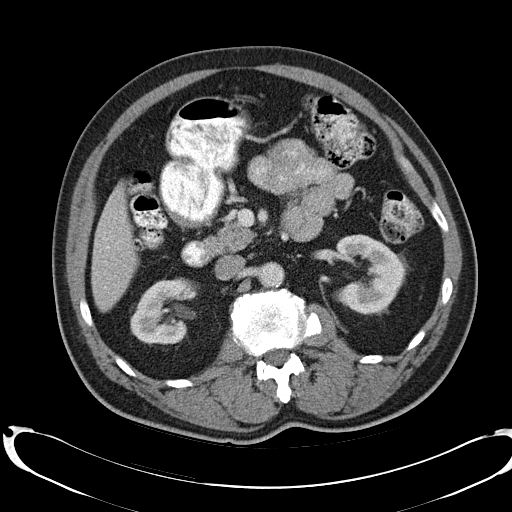
[im 60/90  bone]
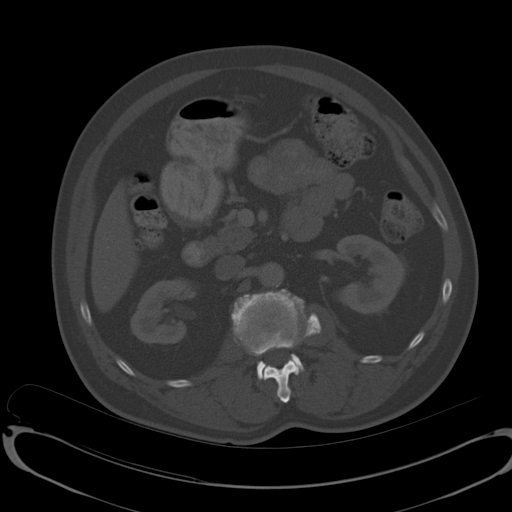
[im 65/90  soft-tissue]
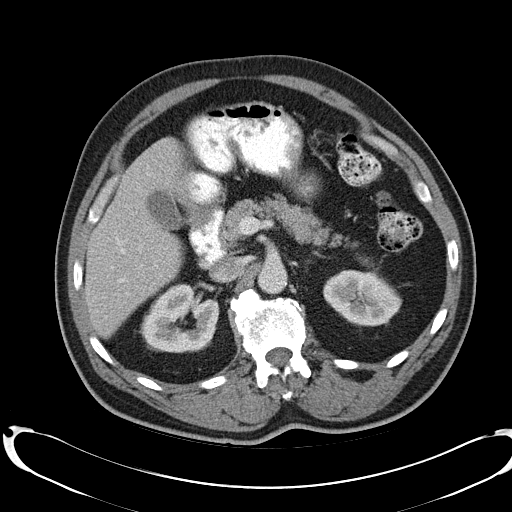
[im 70/90  soft-tissue]
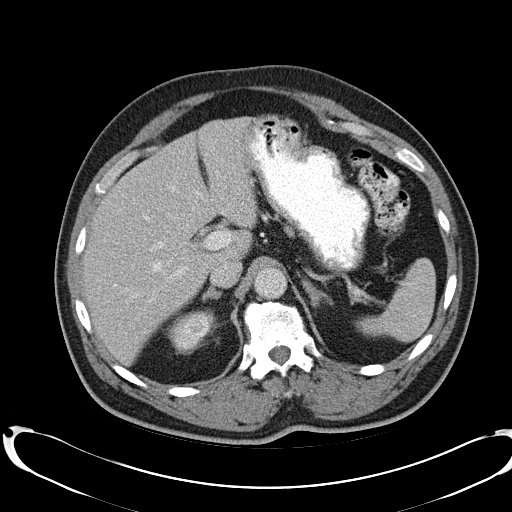
[im 80/90  soft-tissue]
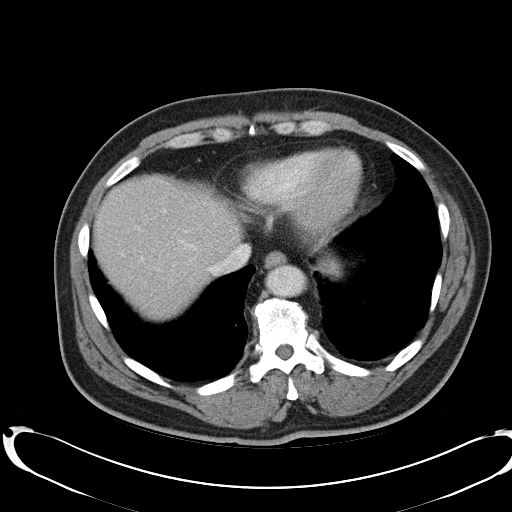
[im 85/90  soft-tissue]
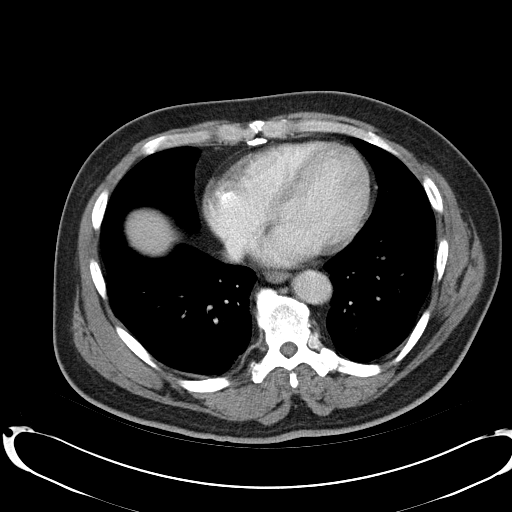

[Series 3: abd_pel_with 3.0 spo · coronal · 0.74mm/px · 3 of 97 slices shown]
[im 33/97  soft-tissue]
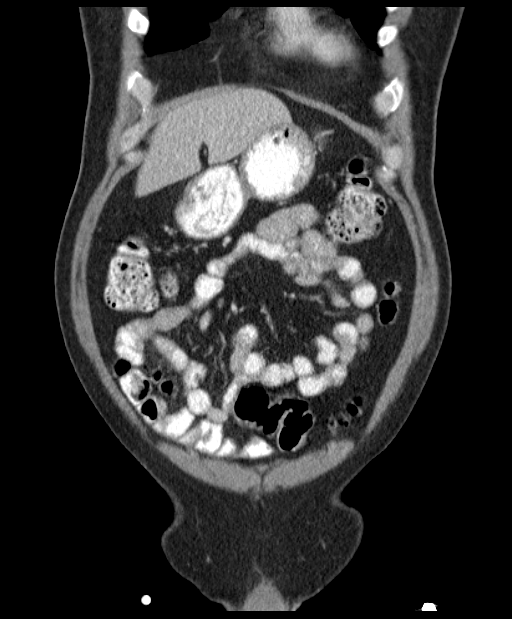
[im 43/97  soft-tissue]
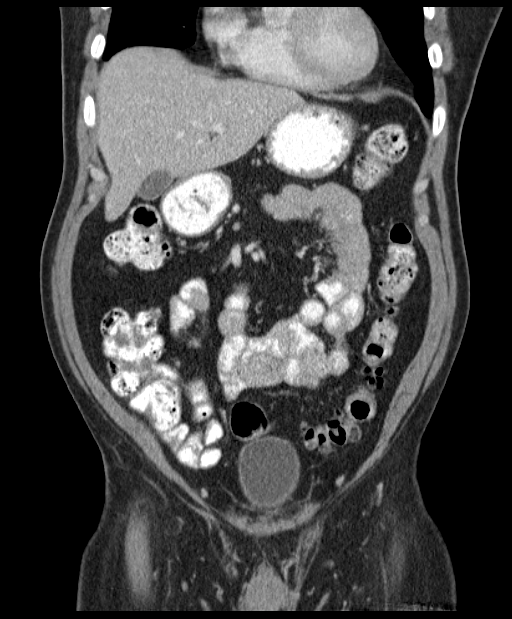
[im 54/97  soft-tissue]
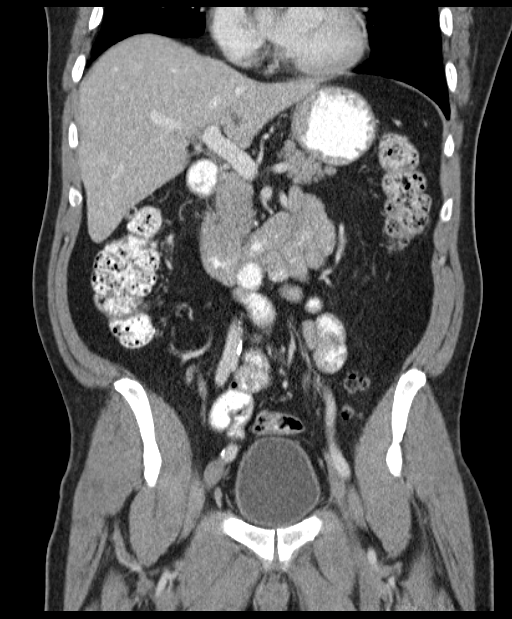

[16 of 46 positions shown; findings below may reference images not displayed]

FINDINGS: Lung bases demonstrate some minimal scarring in the right lower
lobe. No acute infiltrate is seen.

The liver, gallbladder, spleen, adrenal glands and pancreas are
within normal limits. The kidneys show a small 2 mm nonobstructing
stone in the upper pole of the left kidney and a small 2 mm stone in
the lower pole of the right kidney. The collecting systems are
within normal limits. No ureteral calculi are seen. The bladder is
partially distended.

The appendix is within normal limits. Mild diverticular change of
the colon is seen without diverticulitis. No pelvic mass lesion or
sidewall abnormality is noted. Mild aortoiliac calcifications are
seen without aneurysmal dilatation. The bony structures show
degenerative change of the lumbar spine without acute abnormality.
IMPRESSION: Tiny nonobstructing renal stones bilaterally. No other focal
abnormality is seen.

## 2017-09-10 ENCOUNTER — Emergency Department (HOSPITAL_COMMUNITY)
Admission: EM | Admit: 2017-09-10 | Discharge: 2017-09-10 | Disposition: A | Discharge status: Home / Self Care | Payer: Medicare HMO | Attending: Emergency Medicine | Admitting: Emergency Medicine

## 2017-09-10 ENCOUNTER — Encounter (HOSPITAL_COMMUNITY): Payer: Self-pay | Admitting: Emergency Medicine

## 2017-09-10 ENCOUNTER — Emergency Department (HOSPITAL_COMMUNITY): Payer: Medicare HMO

## 2017-09-10 ENCOUNTER — Other Ambulatory Visit: Payer: Self-pay

## 2017-09-10 DIAGNOSIS — M542 Cervicalgia: Secondary | ICD-10-CM | POA: Diagnosis present

## 2017-09-10 DIAGNOSIS — M436 Torticollis: Secondary | ICD-10-CM | POA: Diagnosis not present

## 2017-09-10 DIAGNOSIS — Z79899 Other long term (current) drug therapy: Secondary | ICD-10-CM | POA: Insufficient documentation

## 2017-09-10 DIAGNOSIS — Z8546 Personal history of malignant neoplasm of prostate: Secondary | ICD-10-CM | POA: Diagnosis not present

## 2017-09-10 DIAGNOSIS — F17210 Nicotine dependence, cigarettes, uncomplicated: Secondary | ICD-10-CM | POA: Diagnosis not present

## 2017-09-10 DIAGNOSIS — E119 Type 2 diabetes mellitus without complications: Secondary | ICD-10-CM | POA: Insufficient documentation

## 2017-09-10 DIAGNOSIS — Z7982 Long term (current) use of aspirin: Secondary | ICD-10-CM | POA: Diagnosis not present

## 2017-09-10 DIAGNOSIS — Z7984 Long term (current) use of oral hypoglycemic drugs: Secondary | ICD-10-CM | POA: Insufficient documentation

## 2017-09-10 DIAGNOSIS — I10 Essential (primary) hypertension: Secondary | ICD-10-CM | POA: Diagnosis not present

## 2017-09-10 MED ORDER — METHOCARBAMOL 500 MG PO TABS: 500.0000 mg | ORAL_TABLET | Freq: Three times a day (TID) | ORAL | 0 refills | Status: DC

## 2017-09-10 MED ORDER — HYDROCODONE-ACETAMINOPHEN 5-325 MG PO TABS: ORAL_TABLET | ORAL | 0 refills | Status: DC

## 2017-09-10 NOTE — ED Notes (Signed)
Complaint of neck pain since lifting a paint can earlier in the week  Has seen no physician  Awaiting eval

## 2017-09-10 NOTE — ED Triage Notes (Signed)
Lifting above his head to pick up paint can on Tuesday, has had pain to rt side of neck since then

## 2017-09-10 NOTE — Discharge Instructions (Addendum)
Apply ice packs on/off to your neck.  Follow-up with your doctor or return here for any worsening symptoms

## 2017-09-10 NOTE — ED Notes (Signed)
TT in to assess and discuss findings

## 2017-09-10 NOTE — ED Notes (Signed)
Pt has not seen physician nor has he taken any OTC meds since injury per his report   Education: OTC meds to assist with pain control early not late

## 2017-09-12 NOTE — ED Provider Notes (Signed)
[facility] EMERGENCY DEPARTMENT Provider Note   CSN: [id number] Arrival date & time: 09/10/17  1191     History   Chief Complaint Chief Complaint  Patient presents with  . Neck Injury    HPI Nicholas Vaughan is a 67 y.o. male.  HPI  Nicholas Vaughan is a 67 y.o. male who presents to the Emergency Department complaining of right-sided neck pain for 4 days.  States the pain began when he reached with the pain can in his hand.  Felt a sharp burning pain to his neck.  Pain is associated with movement and improves at rest.  Is tried over-the-counter medication without relief.  He denies chest pain, pain, weakness, or numbness to the upper extremities, jaw pain headache or visual changes.   Past Medical History:  Diagnosis Date  . Cancer [facility])    prostate 2013  . Diabetes mellitus without complication ([place])   . GERD (gastroesophageal reflux disease)   . High cholesterol   . Hypertension     Patient Active Problem List   Diagnosis Date Noted  . Medial meniscus, posterior horn derangement   . Lateral meniscus tear, current   . Primary osteoarthritis of right knee   . GERD (gastroesophageal reflux disease) 07/16/2015  . High cholesterol 07/16/2015  . Essential hypertension 07/16/2015  . Diabetes ([place]) 07/16/2015    Past Surgical History:  Procedure Laterality Date  . ANKLE SURGERY    . KNEE ARTHROSCOPY WITH LATERAL MENISECTOMY Right 10/30/2015   Procedure: RIGHT KNEE ARTHROSCOPY WITH LATERAL AND MEDIAL MENISECTOMY;  Surgeon: Carole Civil, MD;  Location: [facility];  Service: Orthopedics;  Laterality: Right;  . PROSTATE SURGERY     2013 Prostate cancer  . THROAT SURGERY          Home Medications    Prior to Admission medications   Medication Sig Start Date End Date Taking? Authorizing Provider  acetaminophen-codeine (TYLENOL #4) 300-60 MG tablet Take 1 tablet by mouth every 4 (four) hours as needed for moderate pain. Patient not taking: Reported on 02/04/2017  11/13/15   Carole Civil, MD  amoxicillin-clavulanate (AUGMENTIN) 500-125 MG tablet Take 1 tablet by mouth every 12 (twelve) hours. Until completed 02/03/17   [provider]  aspirin EC 81 MG tablet Take 81 mg by mouth daily.      [provider]  atenolol (TENORMIN) 25 MG tablet Take 25 mg by mouth daily.    [provider]  atorvastatin (LIPITOR) 40 MG tablet Take 80 mg by mouth daily.     [provider]  atorvastatin (LIPITOR) 80 MG tablet Take 80 mg by mouth daily. 01/04/17   [provider]  cetirizine (ZYRTEC) 10 MG tablet Take 10 mg by mouth daily.    [provider]  HYDROcodone-acetaminophen (NORCO/VICODIN) 5-325 MG tablet Take one tab po q 4 hrs prn pain 09/10/17   Kaitlin Alcindor, PA-C  metFORMIN (GLUCOPHAGE) 1000 MG tablet Take 1,000 mg by mouth 2 (two) times daily with a meal.    [provider]  methocarbamol (ROBAXIN) 500 MG tablet Take 1 tablet (500 mg total) by mouth 3 (three) times daily. 09/10/17   Taran Haynesworth, PA-C  omeprazole (PRILOSEC) 20 MG capsule Take 40 mg by mouth daily.     [provider]  promethazine (PHENERGAN) 12.5 MG tablet Take 1 tablet (12.5 mg total) by mouth every 6 (six) hours as needed for nausea or vomiting. Patient not taking: Reported on 02/04/2017 10/30/15   Carole Civil,  MD  senna-docusate (SENOKOT-S) 8.6-50 MG tablet Take 1 tablet by mouth daily as needed for mild constipation or moderate constipation.     [provider]    Family History Family History  Problem Relation Age of Onset  . Diabetes Maternal Aunt     Social History Social History   Tobacco Use  . Smoking status: Current Every Day Smoker    Packs/day: 0.50    Years: 20.00    Pack years: 10.00    Last attempt to quit: 10/23/2010    Years since quitting: 6.8  . Smokeless tobacco: Never Used  Substance Use Topics  . Alcohol use: Yes    Alcohol/week: 0.0 oz    Comment: occasional once  a week  . Drug use: Yes    Types: Marijuana     Allergies   Sulfa antibiotics   Review of Systems Review of Systems  Constitutional: Negative for chills and fever.  Eyes: Negative for visual disturbance.  Respiratory: Negative for chest tightness and shortness of breath.   Cardiovascular: Negative for chest pain.  Gastrointestinal: Negative for nausea and vomiting.  Musculoskeletal: Positive for neck pain. Negative for arthralgias and joint swelling.  Skin: Negative for color change and wound.  Neurological: Negative for dizziness, facial asymmetry, weakness, numbness and headaches.  All other systems reviewed and are negative.    Physical Exam Updated Vital Signs BP (!) 128/94 (BP Location: Right Arm)   Pulse 74   Temp 98.6 F (37 C) (Oral)   Resp 16   Ht 5\' 7"  (1.702 m)   Wt 83 kg (183 lb)   SpO2 98%   BMI 28.66 kg/m   Physical Exam  Constitutional: He is oriented to person, place, and time. He appears well-nourished. No distress.  HENT:  Head: Atraumatic.  Mouth/Throat: Oropharynx is clear and moist.  Eyes: Pupils are equal, round, and reactive to light. Conjunctivae and EOM are normal.  Neck: Normal range of motion and phonation normal. Muscular tenderness present. No spinous process tenderness present. No neck rigidity. No edema, no erythema and normal range of motion present.  Tender to palpation along the right cervical paraspinal muscles and right trapezius muscles.  Cardiovascular: Normal rate, regular rhythm and intact distal pulses.  Pulmonary/Chest: Effort normal and breath sounds normal. No respiratory distress.  Musculoskeletal: Normal range of motion.       Cervical back: He exhibits tenderness and bony tenderness. He exhibits normal pulse.  No spinal tenderness, no edema.  Grip strength is strong and symmetrical bilaterally.  Pain is not reproduced with range of motion of the upper extremities.  Neurological: He is alert and oriented to person, place,  and time. No sensory deficit.  Cranial nerves III through XII intact, no pronator drift.  Speech clear.  Skin: Skin is warm. Capillary refill takes less than 2 seconds. No rash noted.  Psychiatric: He has a normal mood and affect. Thought content normal.  Nursing note and vitals reviewed.    ED Treatments / Results  Labs (all labs ordered are listed, but only abnormal results are displayed) Labs Reviewed - No data to display  EKG None  Radiology No results found.  Procedures Procedures (including critical care time)  Medications Ordered in ED Medications - No data to display   Initial Impression / Assessment and Plan / ED Course  I have reviewed the triage vital signs and the nursing notes.  Pertinent labs & imaging results that were available during my care of the patient were  reviewed by me and considered in my medical decision making (see chart for details).     Patient with unilateral neck pain associated with movement only.  Neurovascularly intact.  No focal neuro deficits.  Pain is reproducible with palpation and movement of the neck.  No extremity weakness.  Pain is felt to be musculoskeletal.  Vitals reviewed.  Patient agrees to treatment plan and close PCP follow-up.  Return precautions were discussed.  Final Clinical Impressions(s) / ED Diagnoses   Final diagnoses:  Torticollis    ED Discharge Orders        Ordered    methocarbamol (ROBAXIN) 500 MG tablet  3 times daily     09/10/17 1159    HYDROcodone-acetaminophen (NORCO/VICODIN) 5-325 MG tablet     09/10/17 1159       Kem Parkinson, PA-C 09/12/17 1945    Nat Christen, MD 09/15/17 [id number]

## 2017-10-18 ENCOUNTER — Encounter (INDEPENDENT_AMBULATORY_CARE_PROVIDER_SITE_OTHER): Payer: Self-pay | Admitting: Internal Medicine

## 2017-10-18 ENCOUNTER — Ambulatory Visit (INDEPENDENT_AMBULATORY_CARE_PROVIDER_SITE_OTHER): Payer: Medicare HMO | Admitting: Internal Medicine

## 2017-10-18 VITALS — BP 150/90 | HR 60 | Temp 97.9°F | Ht 67.0 in | Wt 186.7 lb

## 2017-10-18 DIAGNOSIS — K219 Gastro-esophageal reflux disease without esophagitis: Secondary | ICD-10-CM | POA: Diagnosis not present

## 2017-10-18 DIAGNOSIS — R066 Hiccough: Secondary | ICD-10-CM | POA: Diagnosis not present

## 2017-10-18 NOTE — Patient Instructions (Addendum)
Continue the Omeprazole.  OV in 1 year.Hiccups A hiccup is the result of a sudden shortening of the muscle below your lungs (diaphragm). This movement of your diaphragm causes a sudden inhalation followed by the closing of your vocal cords, which causes the hiccup sound. Most people get the hiccups. Typically, hiccups last only a short amount of time. There are three types of hiccups:  Benign. These hiccups last less than 48 hours.  Persistent. These hiccups last more than 48 hours, but less than 1 month.  Intractable. These hiccups last more than 1 month.  A hiccup is a reflex. You cannot control reflexes. Follow these instructions at home: Watch your hiccups for any changes. The following actions may help to lessen any discomfort that you are feeling:  Eat small meals.  Limit alcohol intake to no more than 1 drink per day for nonpregnant women and 2 drinks per day for men. One drink equals 12 oz of beer, 5 oz of wine, or 1 oz of hard liquor.  Limit drinking carbonated or fizzy drinks, such as soda.  Eat and chew your food slowly.  Avoid eating or drinking hot or spicy foods and drinks.  Take medicines only as directed by your health care provider.  Contact a health care provider if:  Your hiccups last for more than 48 hours.  Your hiccups do not improve with treatment.  You cannot sleep or eat due to the hiccups.  You have unexpected weight loss due to the hiccups.  You have a fever.  You have trouble breathing or swallowing.  You develop severe pain in your abdomen.  You develop numbness, tingling, or weakness. This information is not intended to replace advice given to you by your health care provider. Make sure you discuss any questions you have with your health care provider. Document Released: 06/21/2001 Document Revised: 09/18/2015 Document Reviewed: 04/08/2014 Elsevier Interactive Patient Education  [employer].

## 2017-10-18 NOTE — Progress Notes (Signed)
   Subjective:    Patient ID: Nicholas Vaughan, male    DOB: 1950/06/15, 67 y.o.   MRN: [id number]  HPI Here today for f/u. Last seen in June of 2018. Hx of chronic GERD and maintained on Omeprazole. He tells me he has the hiccup since last night.  His appetite is good. No weight loss. Has a BM daily. No melena or BRRB. No abdominal pain.  States he started hiccuping after eating a banana and a piece of cake yesterday afternoon.,  Review of Systems . Past Medical History:  Diagnosis Date  . Cancer [facility])    prostate 2013  . Diabetes mellitus without complication ([place])   . GERD (gastroesophageal reflux disease)   . High cholesterol   . Hypertension     Past Surgical History:  Procedure Laterality Date  . ANKLE SURGERY    . KNEE ARTHROSCOPY WITH LATERAL MENISECTOMY Right 10/30/2015   Procedure: RIGHT KNEE ARTHROSCOPY WITH LATERAL AND MEDIAL MENISECTOMY;  Surgeon: Carole Civil, MD;  Location: [facility];  Service: Orthopedics;  Laterality: Right;  . PROSTATE SURGERY     2013 Prostate cancer  . THROAT SURGERY      Allergies  Allergen Reactions  . Sulfa Antibiotics Itching    Current Outpatient Medications on File Prior to Visit  Medication Sig Dispense Refill  . acetaminophen-codeine (TYLENOL #4) 300-60 MG tablet Take 1 tablet by mouth every 4 (four) hours as needed for moderate pain. 42 tablet 0  . aspirin EC 81 MG tablet Take 81 mg by mouth daily.      Marland Kitchen atenolol (TENORMIN) 25 MG tablet Take 25 mg by mouth daily.    Marland Kitchen atorvastatin (LIPITOR) 80 MG tablet Take 80 mg by mouth daily.    . cetirizine (ZYRTEC) 10 MG tablet Take 10 mg by mouth daily.    . metFORMIN (GLUCOPHAGE) 1000 MG tablet Take 1,000 mg by mouth 2 (two) times daily with a meal.    . omeprazole (PRILOSEC) 20 MG capsule Take 40 mg by mouth daily.     . predniSONE (DELTASONE) 20 MG tablet Take 20 mg by mouth daily with breakfast. taper    . senna-docusate (SENOKOT-S) 8.6-50 MG tablet Take 1 tablet by mouth daily as  needed for mild constipation or moderate constipation.      No current facility-administered medications on file prior to visit.         Objective:   Physical Exam Blood pressure (!) 150/90, pulse 60, temperature 97.9 F (36.6 C), height 5\' 7"  (1.702 m), weight 186 lb 11.2 oz (84.7 kg). Alert and oriented. Skin warm and dry. Oral mucosa is moist.   . Sclera anicteric, conjunctivae is pink. Thyroid not enlarged. No cervical lymphadenopathy. Lungs clear. Heart regular rate and rhythm.  Abdomen is soft. Bowel sounds are positive. No hepatomegaly. No abdominal masses felt. No tenderness.  No edema to lower extremities. Patient is alert and oriented.         Assessment & Plan:  GERD. Continue the Omeprazole.  OV in 1 year. Hiccups. Try cold water, hold breath. Instructions on Hiccups given to patient via patient handout

## 2018-10-19 ENCOUNTER — Encounter (INDEPENDENT_AMBULATORY_CARE_PROVIDER_SITE_OTHER): Payer: Self-pay | Admitting: *Deleted

## 2018-10-19 ENCOUNTER — Encounter (INDEPENDENT_AMBULATORY_CARE_PROVIDER_SITE_OTHER): Payer: Self-pay | Admitting: Internal Medicine

## 2018-10-19 ENCOUNTER — Other Ambulatory Visit: Payer: Self-pay

## 2018-10-19 ENCOUNTER — Ambulatory Visit (INDEPENDENT_AMBULATORY_CARE_PROVIDER_SITE_OTHER): Payer: Medicare HMO | Admitting: Internal Medicine

## 2018-10-19 VITALS — BP 154/83 | HR 68 | Temp 97.5°F | Ht 67.0 in | Wt 178.3 lb

## 2018-10-19 DIAGNOSIS — R1013 Epigastric pain: Secondary | ICD-10-CM

## 2018-10-19 NOTE — Progress Notes (Signed)
   Subjective:    Patient ID: Nicholas Vaughan, male    DOB: 1950-11-08, 68 y.o.   MRN: [id number]  HPI Here today for f/u. Last seen in June of 2019. Hx of chronic GERD and maintained on Omeprazole.  He underwent a colonoscopy last week in [place] and reports it was normal. [facility]). He tells me he is doing good. Sometimes he has epigastric pain. The pain is random. He takes his Omeprazole daily.  He states has frequent constipation. He either takes a laxative or castor oil. ( I advised him not to take Castor oil).   Review of Systems Past Medical History:  Diagnosis Date  . Cancer [facility])    prostate 2013  . Diabetes mellitus without complication ([place])   . GERD (gastroesophageal reflux disease)   . High cholesterol   . Hypertension     Past Surgical History:  Procedure Laterality Date  . ANKLE SURGERY    . KNEE ARTHROSCOPY WITH LATERAL MENISECTOMY Right 10/30/2015   Procedure: RIGHT KNEE ARTHROSCOPY WITH LATERAL AND MEDIAL MENISECTOMY;  Surgeon: Carole Civil, MD;  Location: [facility];  Service: Orthopedics;  Laterality: Right;  . PROSTATE SURGERY     2013 Prostate cancer  . THROAT SURGERY      Allergies  Allergen Reactions  . Sulfa Antibiotics Itching    Current Outpatient Medications on File Prior to Visit  Medication Sig Dispense Refill  . acetaminophen-codeine (TYLENOL #4) 300-60 MG tablet Take 1 tablet by mouth every 4 (four) hours as needed for moderate pain. 42 tablet 0  . aspirin EC 81 MG tablet Take 81 mg by mouth daily.      Marland Kitchen atenolol (TENORMIN) 25 MG tablet Take 25 mg by mouth daily.    Marland Kitchen atorvastatin (LIPITOR) 80 MG tablet Take 80 mg by mouth daily.    . cetirizine (ZYRTEC) 10 MG tablet Take 10 mg by mouth daily.    . metFORMIN (GLUCOPHAGE) 1000 MG tablet Take 1,000 mg by mouth 2 (two) times daily with a meal.    . omeprazole (PRILOSEC) 20 MG capsule Take 40 mg by mouth daily.     Marland Kitchen senna-docusate (SENOKOT-S) 8.6-50 MG tablet Take 1 tablet by  mouth daily as needed for mild constipation or moderate constipation.     . predniSONE (DELTASONE) 20 MG tablet Take 20 mg by mouth daily with breakfast. taper     No current facility-administered medications on file prior to visit.         Objective:   Physical Exam Blood pressure (!) 154/83, pulse 68, temperature (!) 97.5 F (36.4 C), height 5\' 7"  (1.702 m), weight 178 lb 4.8 oz (80.9 kg). Alert and oriented. Skin warm and dry. Oral mucosa is moist.   . Sclera anicteric, conjunctivae is pink. Thyroid not enlarged. No cervical lymphadenopathy. Lungs clear. Heart regular rate and rhythm.  Abdomen is soft. Bowel sounds are positive. No hepatomegaly. No abdominal masses felt. No tenderness.  No edema to lower extremities.         Assessment & Plan:  GERD. Continue the Omeprazole. OV in 1 year. Epigastric pain: Will get an US abdomen.

## 2018-10-19 NOTE — Patient Instructions (Signed)
Get a stool softener. US abdomen

## 2018-10-24 ENCOUNTER — Other Ambulatory Visit: Payer: Self-pay

## 2018-10-24 ENCOUNTER — Ambulatory Visit (HOSPITAL_COMMUNITY)
Admission: RE | Admit: 2018-10-24 | Discharge: 2018-10-24 | Disposition: A | Discharge status: Home / Self Care | Payer: Medicare HMO | Source: Ambulatory Visit | Attending: Internal Medicine | Admitting: Internal Medicine

## 2018-10-24 DIAGNOSIS — R1013 Epigastric pain: Secondary | ICD-10-CM | POA: Insufficient documentation

## 2022-01-18 ENCOUNTER — Other Ambulatory Visit: Payer: Self-pay | Admitting: *Deleted

## 2022-01-18 DIAGNOSIS — K469 Unspecified abdominal hernia without obstruction or gangrene: Secondary | ICD-10-CM

## 2022-01-26 ENCOUNTER — Encounter: Payer: Self-pay | Admitting: General Surgery

## 2022-01-26 ENCOUNTER — Ambulatory Visit: Payer: Medicare HMO | Admitting: General Surgery

## 2022-01-26 VITALS — BP 115/73 | HR 69 | Temp 98.5°F | Resp 12 | Ht 67.0 in | Wt 177.0 lb

## 2022-01-26 DIAGNOSIS — R1013 Epigastric pain: Secondary | ICD-10-CM | POA: Insufficient documentation

## 2022-01-26 DIAGNOSIS — M6208 Separation of muscle (nontraumatic), other site: Secondary | ICD-10-CM | POA: Insufficient documentation

## 2022-01-26 NOTE — Patient Instructions (Addendum)
Concern for possible hernia versus diastasis recti. Will get a CT to confirm but I think this is just diastasis recti and will not need surgery.  You can do exercises to help with diastasis recti.  You are good to continue to cut grass and work around the house etc. You can look diastasis recti exercises up online like on YouTube. This may help with the bulge.  Once we get the CT we will know 100% that there is no hole (hernia) but I did not feel anything like a hernia today.

## 2022-01-26 NOTE — Progress Notes (Unsigned)
[facility] History and Physical  Reason for Referral:? Hernia  Referring Physician: Coolidge Breeze, FNP   Chief Complaint   New Patient (Initial Visit)     Laurin Morgenstern is a 71 y.o. male.  HPI: MR. Brannigan is a 71 yo who comes in with complaints of a bulge in the top of his abdomen. He denies any major pain or nausea, vomiting, or obstructive symptoms. He says that he sees this bulge when he is laying flat and then lifts up. It is sore then and when he pushes on it it can be sore/ painful.   He has had this for a few years but it is getting worse.   Past Medical History:  Diagnosis Date   Cancer [facility])    prostate 2013   Diabetes mellitus without complication ([facility])    GERD (gastroesophageal reflux disease)    High cholesterol    Hypertension     Past Surgical History:  Procedure Laterality Date   ANKLE SURGERY     KNEE ARTHROSCOPY WITH LATERAL MENISECTOMY Right 10/30/2015   Procedure: RIGHT KNEE ARTHROSCOPY WITH LATERAL AND MEDIAL MENISECTOMY;  Surgeon: Carole Civil, MD;  Location: [facility];  Service: Orthopedics;  Laterality: Right;   PROSTATE SURGERY     2013 Prostate cancer   THROAT SURGERY      Family History  Problem Relation Age of Onset   Diabetes Maternal Aunt     Social History   Tobacco Use   Smoking status: Every Day    Packs/day: 0.50    Years: 20.00    Total pack years: 10.00    Types: Cigarettes    Last attempt to quit: 10/23/2010    Years since quitting: 11.2   Smokeless tobacco: Never  Substance Use Topics   Alcohol use: Yes    Alcohol/week: 0.0 standard drinks of alcohol    Comment: occasional once a week   Drug use: Yes    Types: Marijuana    Medications: I have reviewed the patient's current medications. Allergies as of 01/26/2022       Reactions   Lisinopril Cough   Sulfa Antibiotics Itching        Medication List        Accurate as of January 26, 2022 11:54 AM. If you have any questions, ask your  nurse or doctor.          STOP taking these medications    predniSONE 20 MG tablet Commonly known as: DELTASONE Stopped by: Virl Cagey, MD       TAKE these medications    acetaminophen-codeine 300-60 MG tablet Commonly known as: TYLENOL #4 Take 1 tablet by mouth every 4 (four) hours as needed for moderate pain.   aspirin EC 81 MG tablet Take 81 mg by mouth daily.   atenolol 25 MG tablet Commonly known as: TENORMIN take 1 tablet every day Orally Once a day for 90 days What changed: Another medication with the same name was removed. Continue taking this medication, and follow the directions you see here. Changed by: Virl Cagey, MD   atorvastatin 80 MG tablet Commonly known as: LIPITOR TAKE 1 TABLET BY MOUTH ONCE DAILY. Orally Once a day for 90 days What changed: Another medication with the same name was removed. Continue taking this medication, and follow the directions you see here. Changed by: Virl Cagey, MD   cetirizine 10 MG tablet Commonly known as: ZYRTEC TAKE 1 TABLET EVERY DAY Orally Once a  day for 90 days What changed: Another medication with the same name was removed. Continue taking this medication, and follow the directions you see here. Changed by: Virl Cagey, MD   gabapentin 300 MG capsule Commonly known as: NEURONTIN Take 2 capsules by mouth at bedtime.   metFORMIN 1000 MG tablet Commonly known as: GLUCOPHAGE Take 1,000 mg by mouth 2 (two) times daily with a meal. What changed: Another medication with the same name was removed. Continue taking this medication, and follow the directions you see here. Changed by: Virl Cagey, MD   omeprazole 20 MG capsule Commonly known as: PRILOSEC Take 40 mg by mouth daily.   senna-docusate 8.6-50 MG tablet Commonly known as: Senokot-S Take 1 tablet by mouth daily as needed for mild constipation or moderate constipation.         ROS:  A comprehensive review of systems was  negative except for: Ears, nose, mouth, throat, and face: positive for sinus issues Gastrointestinal: positive for abdominal pain and reflux symptoms Musculoskeletal: positive for neck pain and joint pain  Blood pressure 115/73, pulse 69, temperature 98.5 F (36.9 C), temperature source Oral, resp. rate 12, height '5\' 7"'$  (1.702 m), weight 177 lb (80.3 kg), SpO2 98 %. Physical Exam Vitals reviewed.  Constitutional:      Appearance: Normal appearance.  HENT:     Head: Normocephalic.     Mouth/Throat:     Mouth: Mucous membranes are moist.  Eyes:     Extraocular Movements: Extraocular movements intact.  Cardiovascular:     Rate and Rhythm: Normal rate.  Pulmonary:     Effort: Pulmonary effort is normal.     Breath sounds: Normal breath sounds.  Abdominal:     General: There is no distension.     Palpations: Abdomen is soft.     Tenderness: There is abdominal tenderness.     Hernia: No hernia is present.     Comments: 4 cm diastasis recti, possible hernia due to thinness, tender with deep palpation  Musculoskeletal:     Cervical back: Normal range of motion.  Skin:    General: Skin is warm.  Neurological:     General: No focal deficit present.     Mental Status: He is alert.  Psychiatric:        Mood and Affect: Mood normal.        Thought Content: Thought content normal.        Judgment: Judgment normal.     Results: Personally reviewed CT abd 2017- no hernia in the epigastric region, has diastasis recti at about 3.5cm, showed images to the patient    Assessment & Plan:  Mattheu Brodersen is a 71 y.o. male with what is likely worsening of this diastasis recti. Discussed the pathophysiology of this and that it can be improved with exercise. Discussed that this can get a hole in them and develop a true hernia. The only way to know for sure is to get a CT scan to evaluate it further.   Concern for possible hernia versus diastasis recti. Will get a CT to confirm but I think this  is just diastasis recti and will not need surgery.  You can do exercises to help with diastasis recti.  You are good to continue to cut grass and work around the house etc. You can look diastasis recti exercises up online like on YouTube. This may help with the bulge.  Once we get the CT we will know 100% that there  is no hole (hernia) but I did not feel anything like a hernia today.   All questions were answered to the satisfaction of the patient.  Will call with results    Virl Cagey 01/26/2022, 11:54 AM

## 2022-02-05 ENCOUNTER — Ambulatory Visit (HOSPITAL_COMMUNITY)
Admission: RE | Admit: 2022-02-05 | Discharge: 2022-02-05 | Disposition: A | Discharge status: Home / Self Care | Payer: Medicare HMO | Source: Ambulatory Visit | Attending: General Surgery | Admitting: General Surgery

## 2022-02-05 DIAGNOSIS — R1013 Epigastric pain: Secondary | ICD-10-CM

## 2022-02-05 MED ORDER — IOHEXOL 300 MG/ML  SOLN
100.0000 mL | Freq: Once | INTRAMUSCULAR | Status: AC | PRN
  Administered 2022-02-05: 80 mL via INTRAVENOUS

## 2022-02-05 MED ORDER — IOHEXOL 9 MG/ML PO SOLN
ORAL | Status: AC
  Filled 2022-02-05: qty 500

## 2022-02-09 NOTE — Progress Notes (Signed)
Can you let patient know the CT confirmed no hernia. He has diastasis recti as we had discussed. No operative intervention needed. He can pursue exercises online (youtube- diastasis recti/ postpartum exercises) to help with the diastasis and any soreness he is having.

## 2022-02-10 LAB — POCT I-STAT CREATININE: Creatinine, Ser: 1.4 mg/dL — ABNORMAL HIGH (ref 0.61–1.24)

## 2022-06-11 NOTE — Progress Notes (Unsigned)
Office Visit Note  Patient: Nicholas Vaughan             Date of Birth: 1951-01-21           MRN: [id number]             PCP: Coolidge Breeze, FNP Referring: Coolidge Breeze, FNP Visit Date: 06/14/2022 Occupation: @GUAROCC$ @  Subjective:  Pain in both shoulders and hands  History of Present Illness: Nicholas Vaughan is a 72 y.o. male seen in consultation per request of his PCP.  According the patient he has had pain and discomfort in his hands for the last 7 years which has been progressively getting worse.  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  He states he is having difficulty writing and dropping objects.  He has got decreased grip strength.  He has constant numbness in his bilateral hands which she describes in his thumb index finger and middle finger.  He also has been experiencing discomfort in his both shoulders for the last 2 months.  He did not have any injury.  Pain is worse when he sleeps on the sides.  He has chronic discomfort in his right knee joint.  He states he has surgery many years ago.  He has chronic discomfort in his left ankle joint.  He notices swelling in his left ankle joint.  None of the other joints are painful.  There is no history of joint swelling.  He states his father had arthritis as well.  No history of psoriasis.  There is no family history of autoimmune disease.    Activities of Daily Living:  Patient reports morning stiffness for   denies .   Patient Reports nocturnal pain.  Difficulty dressing/grooming: Denies Difficulty climbing stairs: Denies Difficulty getting out of chair: Denies Difficulty using hands for taps, buttons, cutlery, and/or writing: Reports  Review of Systems  Constitutional:  Negative for fatigue.  HENT:  Negative for mouth sores and mouth dryness.   Eyes:  Negative for dryness.  Respiratory:  Negative for shortness of breath.   Cardiovascular:  Negative for chest pain and palpitations.  Gastrointestinal:   Positive for constipation. Negative for blood in stool and diarrhea.  Endocrine: Negative for increased urination.  Genitourinary:  Negative for involuntary urination.  Musculoskeletal:  Positive for joint pain, joint pain, myalgias, muscle weakness and myalgias. Negative for gait problem, joint swelling and muscle tenderness.  Skin:  Negative for color change, hair loss and sensitivity to sunlight.  Allergic/Immunologic: Negative for susceptible to infections.  Neurological:  Positive for dizziness. Negative for headaches.  Hematological:  Negative for swollen glands.  Psychiatric/Behavioral:  Negative for depressed mood and sleep disturbance. The patient is not nervous/anxious.     PMFS History:  Patient Active Problem List   Diagnosis Date Noted   Diastasis recti 01/26/2022   Abdominal pain, epigastric 01/26/2022   Medial meniscus, posterior horn derangement    Lateral meniscus tear, current    Primary osteoarthritis of right knee    GERD (gastroesophageal reflux disease) 07/16/2015   High cholesterol 07/16/2015   Essential hypertension 07/16/2015   Diabetes ([place]) 07/16/2015    Past Medical History:  Diagnosis Date   Cancer [facility])    prostate 2013   Diabetes mellitus without complication (HCC)    GERD (gastroesophageal reflux disease)    High cholesterol    Hypertension     Family History  Problem Relation Age of Onset   Diabetes Maternal  Aunt    Past Surgical History:  Procedure Laterality Date   ANKLE SURGERY     KNEE ARTHROSCOPY WITH LATERAL MENISECTOMY Right 10/30/2015   Procedure: RIGHT KNEE ARTHROSCOPY WITH LATERAL AND MEDIAL MENISECTOMY;  Surgeon: Carole Civil, MD;  Location: [facility];  Service: Orthopedics;  Laterality: Right;   PROSTATE SURGERY     2013 Prostate cancer   THROAT SURGERY     Social History   Social History Narrative   Not on file    There is no immunization history on file for this patient.   Objective: Vital Signs: BP 135/84 (BP  Location: Right Arm, Patient Position: Sitting, Cuff Size: Normal)   Pulse 60   Resp 12   Ht 5' 6"$  (1.676 m)   Wt 176 lb (79.8 kg)   BMI 28.41 kg/m    Physical Exam Vitals and nursing note reviewed.  Constitutional:      Appearance: He is well-developed.  HENT:     Head: Normocephalic and atraumatic.  Eyes:     Conjunctiva/sclera: Conjunctivae normal.     Pupils: Pupils are equal, round, and reactive to light.  Cardiovascular:     Rate and Rhythm: Normal rate and regular rhythm.     Heart sounds: Normal heart sounds.  Pulmonary:     Effort: Pulmonary effort is normal.     Breath sounds: Normal breath sounds.  Abdominal:     General: Bowel sounds are normal.     Palpations: Abdomen is soft.  Musculoskeletal:     Cervical back: Normal range of motion and neck supple.  Skin:    General: Skin is warm and dry.     Capillary Refill: Capillary refill takes less than 2 seconds.  Neurological:     Mental Status: He is alert and oriented to person, place, and time.  Psychiatric:        Behavior: Behavior normal.      Musculoskeletal Exam: Good range of motion of the cervical spine, thoracic and lumbar spine.  There was no SI joint tenderness.  He had painful range of motion of his left shoulder joint with effusion palpable in his left shoulder joint.  Right shoulder joint was in good range of motion with discomfort.  He had 5 degree contracture in his right elbow joint without synovitis.  Has CMC PIP and DIP thickening with no synovitis was noted.  Tinel's and Phalen's test was positive.  Manual compression test was positive.  Hip joints were in limited range of motion without discomfort.  Warmth and mild effusion was noted in his right knee joint.  Left knee joint was in good range of motion without any discomfort.  He had warmth and swelling over his left ankle joint.  Right ankle joint was in good range of motion.  There was no MTP or PIP tenderness or swelling was noted.  CDAI  Exam: CDAI Score: 6.2  Patient Global: 6 mm; Provider Global: 6 mm Swollen: 3 ; Tender: 4  Joint Exam 06/14/2022      Right  Left  Glenohumeral   Tender  Swollen Tender  Knee  Swollen Tender     Ankle     Swollen Tender     Investigation: No additional findings.  Imaging: XR Ankle 2 Views Left  Result Date: 06/14/2022 Severe tibiotalar and subtalar joint space narrowing was noted.  Intertarsal narrowing and dorsal spurring was noted.  Screw was noted in the left tibia.  Erosive changes were noted over the medial  aspect of the tibia. Impression: Severe arthritis of the ankle joint and postsurgical changes were noted.  XR KNEE 3 VIEW RIGHT  Result Date: 06/14/2022 Severe lateral compartment narrowing with lateral and intercondylar osteophytes was noted.  Moderate patellofemoral narrowing was noted.  Vascular calcification was noted. Impression: These findings are consistent with rheumatoid arthritis and osteoarthritis overlap.  XR Shoulder Left  Result Date: 06/14/2022 Glenohumeral joint space narrowing was noted.  Acromioclavicular joint space narrowing was noted.  Erosive changes were noted in the humeral head.  Impression: These findings are consistent with rheumatoid arthritis and osteoarthritis overlap.  XR Shoulder Right  Result Date: 06/14/2022 No glenohumeral or acromioclavicular joint space narrowing was noted.  No chondrocalcinosis was noted. Impression: Unremarkable x-rays of the right shoulder.  XR Hand 2 View Left  Result Date: 06/14/2022 CMC, PIP and DIP narrowing was noted.  First and second MCP joint narrowing was noted.  Intercarpal joint space narrowing was noted.  Possible erosion was noted over the third metacarpal head. Impression: These findings are consistent with rheumatoid arthritis and osteoarthritis overlap.  XR Hand 2 View Right  Result Date: 06/14/2022 Narrowing of the first and second MCP joints was noted.  PIP and DIP narrowing was noted.   Intercarpal and radiocarpal joint space narrowing was noted.  Possible erosion was noted over the ulnar styloid. Impression: These findings are consistent with rheumatoid arthritis and osteoarthritis overlap.   Recent Labs: Lab Results  Component Value Date   WBC 3.8 (L) 02/04/2017   HGB 12.3 (L) 02/04/2017   PLT 281 02/04/2017   NA 142 02/04/2017   K 4.0 02/04/2017   CL 104 02/04/2017   CO2 28 02/04/2017   GLUCOSE 92 02/04/2017   BUN 16 02/04/2017   CREATININE 1.40 (H) 02/05/2022   BILITOT 0.8 02/04/2017   ALKPHOS 43 02/04/2017   AST 26 02/04/2017   ALT 27 02/04/2017   PROT 6.8 02/04/2017   ALBUMIN 3.8 02/04/2017   CALCIUM 9.0 02/04/2017   GFRAA >60 02/04/2017    Speciality Comments: No specialty comments available.  Procedures:  Large Joint Inj: L glenohumeral on 06/14/2022 12:01 PM Indications: pain Details: 27 G 1.5 in needle, anterior approach  Arthrogram: No  Medications: 3 mL lidocaine 1 %; 40 mg triamcinolone acetonide 40 MG/ML Aspirate: 0 mL Outcome: tolerated well, no immediate complications Procedure, treatment alternatives, risks and benefits explained, specific risks discussed. Consent was given by the patient. Immediately prior to procedure a time out was called to verify the correct patient, procedure, equipment, support staff and site/side marked as required. Patient was prepped and draped in the usual sterile fashion.     Allergies: Lisinopril and Sulfa antibiotics   Assessment / Plan:     Visit Diagnoses: Chronic inflammatory arthritis -clinical and radiographic findings are consistent with inflammatory arthritis.  Patient complains of pain and discomfort in multiple joints over the years.  He has had pain and swelling in his hands for the last 7 years.  He states been having pain and discomfort in his bilateral shoulders at least for the last 2 months.  He has had chronic discomfort in his right knee joint and had surgery on his right knee joint many  years ago.  He gives history of recurrent pain and swelling in his left ankle joint.  X-ray findings were discussed with the patient.  Possibility of rheumatoid arthritis was discussed with the patient.  Detailed counseling guarding rheumatoid arthritis was provided.  Possible immunosuppression was discussed in the future.  I will discuss treatment options at the follow-up visit.  Plan: Sedimentation rate, Rheumatoid factor, Cyclic citrul peptide antibody, IgG, Uric acid, ANA  Acute pain of both shoulders -he had discomfort range of motion of bilateral shoulders today.  He had significant synovitis in his left shoulder joint with severe pain.  After informed consent was obtained and side effects were discussed the left shoulder joint was injected with lidocaine and Kenalog as described above.  Patient is diabetic and he has had cortisone injections before.  He was advised to monitor blood glucose level closely.  Plan: XR Shoulder Left, XR Shoulder Right.  X-rays obtained today of the right shoulder joint was unremarkable.  X-rays of the left shoulder joint showed erosive changes in the humeral head and also glenohumeral and acromioclavicular joint space narrowing.  These findings are consistent with rheumatoid arthritis and osteoarthritis overlap.  Pain in both hands -he complains of pain and discomfort in his bilateral hands.  Bilateral CMC PIP and DIP thickening was noted.  No synovitis was noted.  X-rays showed MCP joint narrowing.  Intercarpal joint space narrowing consistent with inflammatory arthritis.  Plan: XR Hand 2 View Right, XR Hand 2 View Left  Paresthesia of both hands-he complains of paresthesias in bilateral hands and also decreased grip strength.  Phalen's and Tinel's test was positive.  I will refer him to nerve conduction velocities  Right ankle swelling -he had severe narrowing of the tibiotalar and subtalar joint space.  Erosive changes were noted over the medial aspect of the tibia.   Postsurgical changes were also noted.  Plan: XR Ankle 2 Views Left  Chronic pain of right knee -he had severe lateral compartment narrowing of the knee with intercondylar and lateral osteophytes.  There was a present cystic changes were noted in the knee joint.  Plan: XR KNEE 3 VIEW RIGHT  High risk medication use -in anticipation to start him on immunosuppressive therapy I will obtain additional labs today.  Plan: CBC with Differential/Platelet, COMPLETE METABOLIC PANEL WITH GFR,Serum protein electrophoresis with reflex, Hepatitis B surface antigen, Hepatitis C antibody, Hepatitis B core antibody, IgM, QuantiFERON-TB Gold Plus, IgG, IgA, IgM  Other fatigue -continues to use experiencing fatigue.  Plan: CK, TSH,   Essential hypertension-blood pressure was normal at 135/84 today.  Gastroesophageal reflux disease without esophagitis  History of type 2 diabetes mellitus-patient is diabetic.  He states his blood sugars have been running normal.  He is on metformin.  Advised him to monitor blood glucose levels closely after the cortisone injection.  History of hyperlipidemia  Orders: Orders Placed This Encounter  Procedures   Large Joint Inj   XR Shoulder Left   XR Shoulder Right   XR Hand 2 View Right   XR Hand 2 View Left   XR KNEE 3 VIEW RIGHT   XR Ankle 2 Views Left   CBC with Differential/Platelet   COMPLETE METABOLIC PANEL WITH GFR   CK   TSH   Sedimentation rate   Rheumatoid factor   Cyclic citrul peptide antibody, IgG   Uric acid   ANA   Serum protein electrophoresis with reflex   Hepatitis B surface antigen   Hepatitis C antibody   Hepatitis B core antibody, IgM   QuantiFERON-TB Gold Plus   IgG, IgA, IgM   No orders of the defined types were placed in this encounter.    Follow-Up Instructions: Return for Pain in joints.   Bo Merino, MD  Note - This record has been created  using Editor, commissioning.  Chart creation errors have been sought, but may not always   have been located. Such creation errors do not reflect on  the standard of medical care.

## 2022-06-14 ENCOUNTER — Ambulatory Visit (INDEPENDENT_AMBULATORY_CARE_PROVIDER_SITE_OTHER): Payer: Medicare HMO

## 2022-06-14 ENCOUNTER — Ambulatory Visit: Payer: Medicare HMO

## 2022-06-14 ENCOUNTER — Ambulatory Visit: Payer: Medicare HMO | Attending: Rheumatology | Admitting: Rheumatology

## 2022-06-14 ENCOUNTER — Encounter: Payer: Self-pay | Admitting: Rheumatology

## 2022-06-14 VITALS — BP 135/84 | HR 60 | Resp 12 | Ht 66.0 in | Wt 176.0 lb

## 2022-06-14 DIAGNOSIS — Z8719 Personal history of other diseases of the digestive system: Secondary | ICD-10-CM

## 2022-06-14 DIAGNOSIS — M25561 Pain in right knee: Secondary | ICD-10-CM | POA: Diagnosis not present

## 2022-06-14 DIAGNOSIS — G8929 Other chronic pain: Secondary | ICD-10-CM

## 2022-06-14 DIAGNOSIS — M25471 Effusion, right ankle: Secondary | ICD-10-CM

## 2022-06-14 DIAGNOSIS — M79642 Pain in left hand: Secondary | ICD-10-CM

## 2022-06-14 DIAGNOSIS — R5383 Other fatigue: Secondary | ICD-10-CM

## 2022-06-14 DIAGNOSIS — M25511 Pain in right shoulder: Secondary | ICD-10-CM

## 2022-06-14 DIAGNOSIS — M25472 Effusion, left ankle: Secondary | ICD-10-CM

## 2022-06-14 DIAGNOSIS — M25512 Pain in left shoulder: Secondary | ICD-10-CM | POA: Diagnosis not present

## 2022-06-14 DIAGNOSIS — M1990 Unspecified osteoarthritis, unspecified site: Secondary | ICD-10-CM

## 2022-06-14 DIAGNOSIS — Z8639 Personal history of other endocrine, nutritional and metabolic disease: Secondary | ICD-10-CM

## 2022-06-14 DIAGNOSIS — M79641 Pain in right hand: Secondary | ICD-10-CM

## 2022-06-14 DIAGNOSIS — K219 Gastro-esophageal reflux disease without esophagitis: Secondary | ICD-10-CM

## 2022-06-14 DIAGNOSIS — R202 Paresthesia of skin: Secondary | ICD-10-CM | POA: Diagnosis not present

## 2022-06-14 DIAGNOSIS — I10 Essential (primary) hypertension: Secondary | ICD-10-CM

## 2022-06-14 DIAGNOSIS — Z79899 Other long term (current) drug therapy: Secondary | ICD-10-CM

## 2022-06-14 DIAGNOSIS — M1711 Unilateral primary osteoarthritis, right knee: Secondary | ICD-10-CM

## 2022-06-14 LAB — CBC WITH DIFFERENTIAL/PLATELET
Eosinophils Relative: 2.4 %
Hemoglobin: 12.5 g/dL — ABNORMAL LOW (ref 13.2–17.1)
Lymphs Abs: 1340 cells/uL (ref 850–3900)
RDW: 12.6 % (ref 11.0–15.0)

## 2022-06-14 MED ORDER — TRIAMCINOLONE ACETONIDE 40 MG/ML IJ SUSP
40.0000 mg | INTRAMUSCULAR | Status: AC | PRN
  Administered 2022-06-14: 40 mg via INTRA_ARTICULAR

## 2022-06-14 MED ORDER — LIDOCAINE HCL 1 % IJ SOLN
3.0000 mL | INTRAMUSCULAR | Status: AC | PRN
  Administered 2022-06-14: 3 mL

## 2022-06-14 NOTE — Addendum Note (Signed)
Addended by: Shona Needles on: 06/14/2022 02:32 PM   Modules accepted: Orders

## 2022-06-15 LAB — COMPLETE METABOLIC PANEL WITH GFR
ALT: 20 U/L (ref 9–46)
CO2: 25 mmol/L (ref 20–32)
Creat: 1.29 mg/dL — ABNORMAL HIGH (ref 0.70–1.28)

## 2022-06-15 LAB — CBC WITH DIFFERENTIAL/PLATELET
Basophils Absolute: 28 cells/uL (ref 0–200)
Total Lymphocyte: 28.5 %
WBC: 4.7 10*3/uL (ref 3.8–10.8)

## 2022-06-15 LAB — TSH: TSH: 1.12 mIU/L (ref 0.40–4.50)

## 2022-06-16 LAB — COMPLETE METABOLIC PANEL WITH GFR
Calcium: 9.1 mg/dL (ref 8.6–10.3)
Chloride: 106 mmol/L (ref 98–110)
Globulin: 3 g/dL (calc) (ref 1.9–3.7)
Glucose, Bld: 97 mg/dL (ref 65–99)

## 2022-06-16 LAB — CBC WITH DIFFERENTIAL/PLATELET
Basophils Relative: 0.6 %
Eosinophils Absolute: 113 cells/uL (ref 15–500)
MCH: 31.3 pg (ref 27.0–33.0)
Monocytes Relative: 10.9 %
Neutro Abs: 2707 cells/uL (ref 1500–7800)

## 2022-06-16 NOTE — Progress Notes (Addendum)
Please add ENA, C3 and C4.

## 2022-06-17 LAB — CK: Total CK: 234 U/L — ABNORMAL HIGH (ref 44–196)

## 2022-06-17 LAB — COMPLETE METABOLIC PANEL WITH GFR
AG Ratio: 1.5 (calc) (ref 1.0–2.5)
Alkaline phosphatase (APISO): 49 U/L (ref 35–144)
Potassium: 4.9 mmol/L (ref 3.5–5.3)
Sodium: 142 mmol/L (ref 135–146)
Total Bilirubin: 0.5 mg/dL (ref 0.2–1.2)

## 2022-06-17 LAB — CBC WITH DIFFERENTIAL/PLATELET
HCT: 37.3 % — ABNORMAL LOW (ref 38.5–50.0)
MCHC: 33.5 g/dL (ref 32.0–36.0)
MCV: 93.3 fL (ref 80.0–100.0)
MPV: 10.2 fL (ref 7.5–12.5)
Neutrophils Relative %: 57.6 %

## 2022-06-18 LAB — ANTI-NUCLEAR AB-TITER (ANA TITER): ANA Titer 1: 1:320 {titer} — ABNORMAL HIGH

## 2022-06-18 LAB — CBC WITH DIFFERENTIAL/PLATELET
Absolute Monocytes: 512 cells/uL (ref 200–950)
Platelets: 261 10*3/uL (ref 140–400)
RBC: 4 10*6/uL — ABNORMAL LOW (ref 4.20–5.80)

## 2022-06-18 LAB — COMPLETE METABOLIC PANEL WITH GFR
AST: 18 U/L (ref 10–35)
Albumin: 4.4 g/dL (ref 3.6–5.1)
BUN/Creatinine Ratio: 14 (calc) (ref 6–22)
BUN: 18 mg/dL (ref 7–25)
Total Protein: 7.4 g/dL (ref 6.1–8.1)
eGFR: 59 mL/min/{1.73_m2} — ABNORMAL LOW (ref >=60)

## 2022-06-18 LAB — PROTEIN ELECTROPHORESIS, SERUM, WITH REFLEX
Albumin ELP: 4.2 g/dL (ref 3.8–4.8)
Alpha 1: 0.3 g/dL (ref 0.2–0.3)
Alpha 2: 0.7 g/dL (ref 0.5–0.9)
Beta 2: 0.4 g/dL (ref 0.2–0.5)
Beta Globulin: 0.4 g/dL (ref 0.4–0.6)
Gamma Globulin: 1.2 g/dL (ref 0.8–1.7)
Total Protein: 7.2 g/dL (ref 6.1–8.1)

## 2022-06-18 LAB — QUANTIFERON-TB GOLD PLUS
Mitogen-NIL: >10 IU/mL
NIL: 0.02 IU/mL
QuantiFERON-TB Gold Plus: NEGATIVE
TB1-NIL: 0.02 IU/mL
TB2-NIL: 0.05 IU/mL

## 2022-06-18 LAB — HEPATITIS C ANTIBODY: Hepatitis C Ab: NONREACTIVE

## 2022-06-18 LAB — ANTI-DNA ANTIBODY, DOUBLE-STRANDED: ds DNA Ab: 1 IU/mL

## 2022-06-18 LAB — TEST AUTHORIZATION

## 2022-06-18 LAB — IGG, IGA, IGM
IgG (Immunoglobin G), Serum: 1294 mg/dL (ref 600–1540)
IgM, Serum: 59 mg/dL (ref 50–300)
Immunoglobulin A: 211 mg/dL (ref 70–320)

## 2022-06-18 LAB — SJOGREN'S SYNDROME ANTIBODS(SSA + SSB)
SSA (Ro) (ENA) Antibody, IgG: <1 AI
SSB (La) (ENA) Antibody, IgG: <1 AI

## 2022-06-18 LAB — SM AND SM/RNP ANTIBODIES
ENA SM Ab Ser-aCnc: <1 AI
SM/RNP: <1 AI

## 2022-06-18 LAB — ANTI-SCLERODERMA ANTIBODY: Scleroderma (Scl-70) (ENA) Antibody, IgG: <1 AI

## 2022-06-18 LAB — ANA: Anti Nuclear Antibody (ANA): POSITIVE — AB

## 2022-06-18 LAB — RHEUMATOID FACTOR: Rheumatoid fact SerPl-aCnc: <14 IU/mL (ref <14)

## 2022-06-18 LAB — URIC ACID: Uric Acid, Serum: 7.1 mg/dL (ref 4.0–8.0)

## 2022-06-18 LAB — HEPATITIS B CORE ANTIBODY, IGM: Hep B C IgM: NONREACTIVE

## 2022-06-18 LAB — SEDIMENTATION RATE: Sed Rate: 11 mm/h (ref 0–20)

## 2022-06-18 LAB — HEPATITIS B SURFACE ANTIGEN: Hepatitis B Surface Ag: NONREACTIVE

## 2022-06-18 LAB — CYCLIC CITRUL PEPTIDE ANTIBODY, IGG: Cyclic Citrullin Peptide Ab: <16 UNITS

## 2022-06-21 NOTE — Progress Notes (Signed)
I will discuss results at the fu visit.

## 2022-06-23 ENCOUNTER — Encounter: Payer: Self-pay | Admitting: Physical Medicine and Rehabilitation

## 2022-06-23 ENCOUNTER — Ambulatory Visit (INDEPENDENT_AMBULATORY_CARE_PROVIDER_SITE_OTHER): Payer: Medicare HMO | Admitting: Physical Medicine and Rehabilitation

## 2022-06-23 DIAGNOSIS — M79601 Pain in right arm: Secondary | ICD-10-CM | POA: Diagnosis not present

## 2022-06-23 DIAGNOSIS — R531 Weakness: Secondary | ICD-10-CM | POA: Diagnosis not present

## 2022-06-23 DIAGNOSIS — R202 Paresthesia of skin: Secondary | ICD-10-CM

## 2022-06-23 DIAGNOSIS — M79641 Pain in right hand: Secondary | ICD-10-CM | POA: Diagnosis not present

## 2022-06-23 DIAGNOSIS — M79642 Pain in left hand: Secondary | ICD-10-CM

## 2022-06-23 NOTE — Progress Notes (Signed)
Numbness and tingling for 10 years. Uses aleve but no other treatment

## 2022-06-23 NOTE — Procedures (Signed)
EMG & NCV Findings: Evaluation of the left median motor nerve showed prolonged distal onset latency (5.9 ms) and decreased conduction velocity (Elbow-Wrist, 44 m/s).  The right median motor nerve showed prolonged distal onset latency (9.6 ms), reduced amplitude (2.8 mV), and decreased conduction velocity (Elbow-Wrist, 35 m/s).  The left ulnar motor nerve showed prolonged distal onset latency (6.6 ms), decreased conduction velocity (B Elbow-Wrist, 51 m/s), and decreased conduction velocity (A Elbow-B Elbow, 48 m/s).  The right ulnar motor nerve showed decreased conduction velocity (B Elbow-Wrist, 51 m/s).  The left median (across palm) sensory nerve showed prolonged distal peak latency (Wrist, 6.0 ms) and prolonged distal peak latency (Palm, 6.3 ms).  The right median (across palm) sensory nerve showed no response (Palm) and prolonged distal peak latency (7.3 ms).  The left ulnar sensory and the right ulnar sensory nerves showed prolonged distal peak latency (L4.5, R4.0 ms) and decreased conduction velocity (Wrist-5th Digit, L31, R35 m/s).  All remaining nerves (as indicated in the following tables) were within normal limits.  Left vs. Right side comparison data for the median motor nerve indicates abnormal L-R latency difference (3.7 ms) and abnormal L-R amplitude difference (58.2 %).  The ulnar motor nerve indicates abnormal L-R latency difference (2.9 ms), abnormal L-R amplitude difference (53.0 %), and abnormal L-R velocity difference (A Elbow-B Elbow, 21 m/s).  The ulnar sensory nerve indicates abnormal L-R latency difference (0.5 ms).    Needle evaluation of the right abductor pollicis brevis muscle showed increased insertional activity and moderately increased spontaneous activity.  All remaining muscles (as indicated in the following table) showed no evidence of electrical instability.    Impression: The above electrodiagnostic study is ABNORMAL and reveals evidence of a severe bilateral median nerve  entrapment at the wrist (carpal tunnel syndrome) affecting sensory and motor components. The lesion is characterized by sensory and motor demyelination with evidence of axonal injury.   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   There is no significant electrodiagnostic evidence of any other focal nerve entrapment, brachial plexopathy or cervical radiculopathy.   Recommendations: 1.  Follow-up with referring physician. 2.  Continue current management of symptoms. 3.  Suggest surgical evaluation for decompression of carpal tunnel.  Can refer to neurology for full workup of neuropathy if felt necessary.  ___________________________ Laurence Spates FAAPMR Board Certified, American Board of Physical Medicine and Rehabilitation    Nerve Conduction Studies Anti Sensory Summary Table   Stim Site NR Peak (ms) Norm Peak (ms) P-T Amp (V) Norm P-T Amp Site1 Site2 Delta-P (ms) Dist (cm) Vel (m/s) Norm Vel (m/s)  Left Median Acr Palm Anti Sensory (2nd Digit)  30.2C  Wrist    *6.0 <3.6 18.4 >10 Wrist Palm 0.3 0.0    Palm    *6.3 <2.0 8.2         Right Median Acr Palm Anti Sensory (2nd Digit)  29.4C  Wrist    *7.3 <3.6 11.0 >10 Wrist Palm  0.0    Palm *NR  <2.0          Left Radial Anti Sensory (Base 1st Digit)  29.3C  Wrist    2.9 <3.1 20.5  Wrist Base 1st Digit 2.9 0.0    Right Radial Anti Sensory (Base 1st Digit)  28.8C  Wrist    2.5 <3.1 7.3  Wrist Base 1st Digit 2.5 0.0    Left Ulnar Anti Sensory (5th Digit)  29.7C  Wrist    *4.5 <3.7 33.4 >15.0 Wrist 5th Digit 4.5 14.0 *31 >38  Right Ulnar Anti Sensory (5th Digit)  29.2C  Wrist    *4.0 <3.7 18.3 >15.0 Wrist 5th Digit 4.0 14.0 *35 >38   Motor Summary Table   Stim Site NR Onset (ms) Norm Onset (ms) O-P Amp (mV) Norm O-P Amp Site1 Site2 Delta-0 (ms) Dist (cm) Vel (m/s) Norm Vel (m/s)  Left Median Motor (Abd Poll Brev)  29.2C  Wrist    *5.9  <4.2 6.7 >5 Elbow Wrist 5.3 23.5 *44 >50  Elbow    11.2  3.0         Right Median Motor (Abd Poll Brev)  29.1C  Wrist    *9.6 <4.2 *2.8 >5 Elbow Wrist 6.3 22.0 *35 >50  Elbow    15.9  3.1         Left Ulnar Motor (Abd Dig Min)  29.3C  Wrist    *6.6 <4.2 3.1 >3 B Elbow Wrist 4.3 22.0 *51 >53  B Elbow    10.9  4.3  A Elbow B Elbow 2.1 10.0 *48 >53  A Elbow    13.0  4.1         Right Ulnar Motor (Abd Dig Min)  29.1C  Wrist    3.7 <4.2 6.6 >3 B Elbow Wrist 4.5 23.0 *51 >53  B Elbow    8.2  6.7  A Elbow B Elbow 1.6 11.0 69 >53  A Elbow    9.8  6.7          EMG   Side Muscle Nerve Root Ins Act Fibs Psw Amp Dur Poly Recrt Int Fraser Din Comment  Right Abd Poll Brev Median C8-T1 *Incr *2+ *2+ Nml Nml 0 Nml Nml   Right 1stDorInt Ulnar C8-T1 Nml Nml Nml Nml Nml 0 Nml Nml   Right PronatorTeres Median C6-7 Nml Nml Nml Nml Nml 0 Nml Nml   Right Biceps Musculocut C5-6 Nml Nml Nml Nml Nml 0 Nml Nml   Right Deltoid Axillary C5-6 Nml Nml Nml Nml Nml 0 Nml Nml     Nerve Conduction Studies Anti Sensory Left/Right Comparison   Stim Site L Lat (ms) R Lat (ms) L-R Lat (ms) L Amp (V) R Amp (V) L-R Amp (%) Site1 Site2 L Vel (m/s) R Vel (m/s) L-R Vel (m/s)  Median Acr Palm Anti Sensory (2nd Digit)  30.2C  Wrist *6.0 *7.3 1.3 18.4 11.0 40.2 Wrist Palm     Palm *6.3   8.2         Radial Anti Sensory (Base 1st Digit)  29.3C  Wrist 2.9 2.5 0.4 20.5 7.3 64.4 Wrist Base 1st Digit     Ulnar Anti Sensory (5th Digit)  29.7C  Wrist *4.5 *4.0 *0.5 33.4 18.3 45.2 Wrist 5th Digit *31 *35 4   Motor Left/Right Comparison   Stim Site L Lat (ms) R Lat (ms) L-R Lat (ms) L Amp (mV) R Amp (mV) L-R Amp (%) Site1 Site2 L Vel (m/s) R Vel (m/s) L-R Vel (m/s)  Median Motor (Abd Poll Brev)  29.2C  Wrist *5.9 *9.6 *3.7 6.7 *2.8 *58.2 Elbow Wrist *44 *35 9  Elbow 11.2 15.9 4.7 3.0 3.1 3.2       Ulnar Motor (Abd Dig Min)  29.3C  Wrist *6.6 3.7 *2.9 3.1 6.6 *53.0 B Elbow Wrist *51 *51 0  B Elbow 10.9 8.2 2.7 4.3 6.7 35.8 A  Elbow B Elbow *48 69 *21  A Elbow  13.0 9.8 3.2 4.1 6.7 38.8          Waveforms:

## 2022-06-23 NOTE — Progress Notes (Signed)
Harlan Mathes - 72 y.o. male MRN [id number]  Date of birth: 12-22-1950  Office Visit Note: Visit Date: 06/23/2022 PCP: Coolidge Breeze, FNP Referred by: Bo Merino, MD  Subjective: Chief Complaint  Patient presents with   Left Hand - Numbness   Right Hand - Numbness   HPI: Albertus Merriam is a 72 y.o. male who comes in today at the request of Dr. Bo Merino for evaluation and management of chronic, worsening and severe pain, numbness and tingling in the Bilateral upper extremities.  Patient is Right hand dominant.  He really reports chronic 10-year history of hand and arm pain right more than left in general.  As of late he has been experiencing more numbness and tingling which is more nocturnal and now more consistently during the day which is more classically in the radial digits.  He has been told he thought he had carpal tunnel syndrome in the past.  He is a diabetic but not on insulin.  I cannot find any recent A1c numbers.  He does have some symptoms in the feet at times.  He does have some neck pain but no real frank radicular symptoms although he does get pain in the arms.  He has failed conservative care with medications and anti-inflammatory.  He has not really worn braces consistently.  He was recently seen by Dr. Estanislado Pandy and has have a workup for rheumatological conditions.    Review of Systems  Musculoskeletal:  Positive for joint pain and neck pain.  Neurological:  Positive for tremors and weakness.  All other systems reviewed and are negative.  Otherwise per HPI.  Assessment & Plan: Visit Diagnoses:    ICD-10-CM   1. Paresthesia of skin  R20.2 NCV with EMG (electromyography)    2. Right arm pain  M79.601     3. Bilateral hand pain  M79.641    M79.642     4. Weakness  R53.1        Plan: Impression: His symptoms seem to be fairly classic carpal tunnel symptoms and this does fit with his exam although he does have features consistent with a  peripheral polyneuropathy from his diabetes.  He likely has some level of pain that he is getting from arthritic or musculotendinous type problems as well.  Electrodiagnostic study performed today.  The above electrodiagnostic study is ABNORMAL and reveals evidence of a severe bilateral median nerve entrapment at the wrist (carpal tunnel syndrome) affecting sensory and motor components. The lesion is characterized by sensory and motor demyelination with evidence of axonal injury.   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   There is no significant electrodiagnostic evidence of any other focal nerve entrapment, brachial plexopathy or cervical radiculopathy.   Recommendations: 1.  Follow-up with referring physician. 2.  Continue current management of symptoms. 3.  Suggest surgical evaluation for decompression of carpal tunnel.  Can refer to neurology for full workup of neuropathy if felt necessary.  Meds & Orders: No orders of the defined types were placed in this encounter.   Orders Placed This Encounter  Procedures   NCV with EMG (electromyography)    Follow-up: Return for Cy Blamer MD.   Procedures: No procedures performed  EMG & NCV Findings: Evaluation of the left median motor nerve showed prolonged distal onset latency (5.9 ms) and decreased conduction velocity (Elbow-Wrist, 44 m/s).  The right median motor nerve showed prolonged distal onset latency (9.6  ms), reduced amplitude (2.8 mV), and decreased conduction velocity (Elbow-Wrist, 35 m/s).  The left ulnar motor nerve showed prolonged distal onset latency (6.6 ms), decreased conduction velocity (B Elbow-Wrist, 51 m/s), and decreased conduction velocity (A Elbow-B Elbow, 48 m/s).  The right ulnar motor nerve showed decreased conduction velocity (B Elbow-Wrist, 51 m/s).  The left median (across palm) sensory nerve showed prolonged distal  peak latency (Wrist, 6.0 ms) and prolonged distal peak latency (Palm, 6.3 ms).  The right median (across palm) sensory nerve showed no response (Palm) and prolonged distal peak latency (7.3 ms).  The left ulnar sensory and the right ulnar sensory nerves showed prolonged distal peak latency (L4.5, R4.0 ms) and decreased conduction velocity (Wrist-5th Digit, L31, R35 m/s).  All remaining nerves (as indicated in the following tables) were within normal limits.  Left vs. Right side comparison data for the median motor nerve indicates abnormal L-R latency difference (3.7 ms) and abnormal L-R amplitude difference (58.2 %).  The ulnar motor nerve indicates abnormal L-R latency difference (2.9 ms), abnormal L-R amplitude difference (53.0 %), and abnormal L-R velocity difference (A Elbow-B Elbow, 21 m/s).  The ulnar sensory nerve indicates abnormal L-R latency difference (0.5 ms).    Needle evaluation of the right abductor pollicis brevis muscle showed increased insertional activity and moderately increased spontaneous activity.  All remaining muscles (as indicated in the following table) showed no evidence of electrical instability.    Impression: The above electrodiagnostic study is ABNORMAL and reveals evidence of a severe bilateral median nerve entrapment at the wrist (carpal tunnel syndrome) affecting sensory and motor components. The lesion is characterized by sensory and motor demyelination with evidence of axonal injury.   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   There is no significant electrodiagnostic evidence of any other focal nerve entrapment, brachial plexopathy or cervical radiculopathy.   Recommendations: 1.  Follow-up with referring physician. 2.  Continue current management of symptoms. 3.  Suggest surgical evaluation for decompression of carpal tunnel.  Can refer to neurology for full workup of  neuropathy if felt necessary.  ___________________________ Laurence Spates FAAPMR Board Certified, American Board of Physical Medicine and Rehabilitation    Nerve Conduction Studies Anti Sensory Summary Table   Stim Site NR Peak (ms) Norm Peak (ms) P-T Amp (V) Norm P-T Amp Site1 Site2 Delta-P (ms) Dist (cm) Vel (m/s) Norm Vel (m/s)  Left Median Acr Palm Anti Sensory (2nd Digit)  30.2C  Wrist    *6.0 <3.6 18.4 >10 Wrist Palm 0.3 0.0    Palm    *6.3 <2.0 8.2         Right Median Acr Palm Anti Sensory (2nd Digit)  29.4C  Wrist    *7.3 <3.6 11.0 >10 Wrist Palm  0.0    Palm *NR  <2.0          Left Radial Anti Sensory (Base 1st Digit)  29.3C  Wrist    2.9 <3.1 20.5  Wrist Base 1st Digit 2.9 0.0    Right Radial Anti Sensory (Base 1st Digit)  28.8C  Wrist    2.5 <3.1 7.3  Wrist Base 1st Digit 2.5 0.0    Left Ulnar Anti Sensory (5th Digit)  29.7C  Wrist    *4.5 <3.7 33.4 >15.0 Wrist 5th Digit 4.5 14.0 *31 >38  Right Ulnar Anti Sensory (5th Digit)  29.2C  Wrist    *4.0 <3.7 18.3 >15.0 Wrist 5th Digit  4.0 14.0 *35 >38   Motor Summary Table   Stim Site NR Onset (ms) Norm Onset (ms) O-P Amp (mV) Norm O-P Amp Site1 Site2 Delta-0 (ms) Dist (cm) Vel (m/s) Norm Vel (m/s)  Left Median Motor (Abd Poll Brev)  29.2C  Wrist    *5.9 <4.2 6.7 >5 Elbow Wrist 5.3 23.5 *44 >50  Elbow    11.2  3.0         Right Median Motor (Abd Poll Brev)  29.1C  Wrist    *9.6 <4.2 *2.8 >5 Elbow Wrist 6.3 22.0 *35 >50  Elbow    15.9  3.1         Left Ulnar Motor (Abd Dig Min)  29.3C  Wrist    *6.6 <4.2 3.1 >3 B Elbow Wrist 4.3 22.0 *51 >53  B Elbow    10.9  4.3  A Elbow B Elbow 2.1 10.0 *48 >53  A Elbow    13.0  4.1         Right Ulnar Motor (Abd Dig Min)  29.1C  Wrist    3.7 <4.2 6.6 >3 B Elbow Wrist 4.5 23.0 *51 >53  B Elbow    8.2  6.7  A Elbow B Elbow 1.6 11.0 69 >53  A Elbow    9.8  6.7          EMG   Side Muscle Nerve Root Ins Act Fibs Psw Amp Dur Poly Recrt Int Fraser Din Comment  Right Abd Poll Brev  Median C8-T1 *Incr *2+ *2+ Nml Nml 0 Nml Nml   Right 1stDorInt Ulnar C8-T1 Nml Nml Nml Nml Nml 0 Nml Nml   Right PronatorTeres Median C6-7 Nml Nml Nml Nml Nml 0 Nml Nml   Right Biceps Musculocut C5-6 Nml Nml Nml Nml Nml 0 Nml Nml   Right Deltoid Axillary C5-6 Nml Nml Nml Nml Nml 0 Nml Nml     Nerve Conduction Studies Anti Sensory Left/Right Comparison   Stim Site L Lat (ms) R Lat (ms) L-R Lat (ms) L Amp (V) R Amp (V) L-R Amp (%) Site1 Site2 L Vel (m/s) R Vel (m/s) L-R Vel (m/s)  Median Acr Palm Anti Sensory (2nd Digit)  30.2C  Wrist *6.0 *7.3 1.3 18.4 11.0 40.2 Wrist Palm     Palm *6.3   8.2         Radial Anti Sensory (Base 1st Digit)  29.3C  Wrist 2.9 2.5 0.4 20.5 7.3 64.4 Wrist Base 1st Digit     Ulnar Anti Sensory (5th Digit)  29.7C  Wrist *4.5 *4.0 *0.5 33.4 18.3 45.2 Wrist 5th Digit *31 *35 4   Motor Left/Right Comparison   Stim Site L Lat (ms) R Lat (ms) L-R Lat (ms) L Amp (mV) R Amp (mV) L-R Amp (%) Site1 Site2 L Vel (m/s) R Vel (m/s) L-R Vel (m/s)  Median Motor (Abd Poll Brev)  29.2C  Wrist *5.9 *9.6 *3.7 6.7 *2.8 *58.2 Elbow Wrist *44 *35 9  Elbow 11.2 15.9 4.7 3.0 3.1 3.2       Ulnar Motor (Abd Dig Min)  29.3C  Wrist *6.6 3.7 *2.9 3.1 6.6 *53.0 B Elbow Wrist *51 *51 0  B Elbow 10.9 8.2 2.7 4.3 6.7 35.8 A Elbow B Elbow *48 69 *21  A Elbow 13.0 9.8 3.2 4.1 6.7 38.8          Waveforms:  Clinical History: No specialty comments available.   He reports that he has been smoking cigarettes. He has a 10.00 pack-year smoking history. He has never used smokeless tobacco.  Recent Labs    06/14/22 1202  LABURIC 7.1    Objective:  VS:  HT:    WT:   BMI:     BP:   HR: bpm  TEMP: ( )  RESP:  Physical Exam Vitals and nursing note reviewed.  Constitutional:      General: He is not in acute distress.    Appearance: Normal appearance. He is well-developed.  HENT:     Head: Normocephalic and atraumatic.  Eyes:      Conjunctiva/sclera: Conjunctivae normal.     Pupils: Pupils are equal, round, and reactive to light.  Cardiovascular:     Rate and Rhythm: Normal rate.     Pulses: Normal pulses.     Heart sounds: Normal heart sounds.  Pulmonary:     Effort: Pulmonary effort is normal. No respiratory distress.  Musculoskeletal:        General: No tenderness.     Cervical back: Normal range of motion and neck supple. No rigidity.     Right lower leg: No edema.     Left lower leg: No edema.     Comments: Inspection reveals some atrophy in the bilateral APB and equivocal atrophy of the FDI but not hand intrinsics.. There is no swelling, color changes, allodynia or dystrophic changes.  He seems to have some CMC joint arthritic changes.  There is 5 out of 5 strength in the bilateral wrist extension, finger abduction and long finger flexion.  There is decreased sensation in a median nerve distribution bilaterally.. There is a negative Froment's test bilaterally. There is a negative Tinel's test at the bilateral wrist and elbow. There is a positive Phalen's test bilaterally. There is a negative Hoffmann's test bilaterally.  Skin:    General: Skin is warm and dry.     Findings: No erythema or rash.  Neurological:     General: No focal deficit present.     Mental Status: He is alert and oriented to person, place, and time.     Sensory: No sensory deficit.     Motor: No weakness or abnormal muscle tone.     Coordination: Coordination normal.     Gait: Gait normal.  Psychiatric:        Mood and Affect: Mood normal.        Behavior: Behavior normal.        Thought Content: Thought content normal.     Ortho Exam  Imaging: No results found.  Past Medical/Family/Surgical/Social History: Medications & Allergies reviewed per EMR, new medications updated. Patient Active Problem List   Diagnosis Date Noted   Diastasis recti 01/26/2022   Abdominal pain, epigastric 01/26/2022   Medial meniscus, posterior horn  derangement    Lateral meniscus tear, current    Primary osteoarthritis of right knee    GERD (gastroesophageal reflux disease) 07/16/2015   High cholesterol 07/16/2015   Essential hypertension 07/16/2015   Diabetes ([place]) 07/16/2015   Past Medical History:  Diagnosis Date   Cancer [facility])    prostate 2013   Diabetes mellitus without complication (HCC)    GERD (gastroesophageal reflux disease)    High cholesterol    Hypertension    Family History  Problem Relation Age of Onset   Diabetes Maternal Aunt    Past Surgical History:  Procedure Laterality Date  ANKLE SURGERY     KNEE ARTHROSCOPY WITH LATERAL MENISECTOMY Right 10/30/2015   Procedure: RIGHT KNEE ARTHROSCOPY WITH LATERAL AND MEDIAL MENISECTOMY;  Surgeon: Carole Civil, MD;  Location: [facility];  Service: Orthopedics;  Laterality: Right;   PROSTATE SURGERY     2013 Prostate cancer   THROAT SURGERY     Social History   Occupational History   Not on file  Tobacco Use   Smoking status: Some Days    Packs/day: 0.50    Years: 20.00    Total pack years: 10.00    Types: Cigarettes    Last attempt to quit: 10/23/2010    Years since quitting: 11.6   Smokeless tobacco: Never  Vaping Use   Vaping Use: Never used  Substance and Sexual Activity   Alcohol use: Yes    Alcohol/week: 0.0 standard drinks of alcohol    Comment: occasional once a week   Drug use: Yes    Types: Marijuana   Sexual activity: Not on file

## 2022-07-01 ENCOUNTER — Other Ambulatory Visit: Payer: Self-pay | Admitting: *Deleted

## 2022-07-01 DIAGNOSIS — M79641 Pain in right hand: Secondary | ICD-10-CM

## 2022-07-01 DIAGNOSIS — R202 Paresthesia of skin: Secondary | ICD-10-CM

## 2022-07-01 NOTE — Progress Notes (Signed)
I called patient's daughter, Karrie Meres, who verbalized understanding.

## 2022-07-23 NOTE — Progress Notes (Unsigned)
Office Visit Note  Patient: Nicholas Vaughan             Date of Birth: [birth year]           MRN: [id number]             PCP: Coolidge Breeze, FNP Referring: Coolidge Breeze, FNP Visit Date: 07/29/2022 Occupation: @GUAROCC @  Subjective:  Pain in multiple joints  History of Present Illness: Nicholas Vaughan is a 72 y.o. male with history of chronic inflammatory arthritis.  He returns today after his initial visit on June 14, 2022.  He was experiencing numbness and tingling in his bilateral hands.  Nerve conduction velocities done by Dr. Ernestina Patches showed bilateral severe carpal tunnel syndrome.  He was evaluated by Dr.Xu and will undergo carpal tunnel release.  He continues to have pain and discomfort in his right knee joint and left ankle.  Left shoulder is feeling much better since the injection.    Activities of Daily Living:  Patient reports morning stiffness for 30 minutes.   Patient Reports nocturnal pain.  Difficulty dressing/grooming: Denies Difficulty climbing stairs: Denies Difficulty getting out of chair: Denies Difficulty using hands for taps, buttons, cutlery, and/or writing: Reports  Review of Systems  Constitutional:  Negative for fatigue.  HENT:  Negative for mouth sores and mouth dryness.   Eyes:  Negative for dryness.  Respiratory:  Negative for shortness of breath.   Cardiovascular:  Negative for chest pain and palpitations.  Gastrointestinal:  Positive for constipation. Negative for blood in stool and diarrhea.  Endocrine: Negative for increased urination.  Genitourinary:  Negative for involuntary urination.  Musculoskeletal:  Positive for joint pain, gait problem, joint pain, joint swelling and morning stiffness. Negative for myalgias, muscle weakness, muscle tenderness and myalgias.  Skin:  Negative for color change, rash, hair loss and sensitivity to sunlight.  Allergic/Immunologic: Negative for susceptible to infections.  Neurological:  Negative for  dizziness and headaches.  Hematological:  Negative for swollen glands.  Psychiatric/Behavioral:  Negative for depressed mood and sleep disturbance. The patient is not nervous/anxious.     PMFS History:  Patient Active Problem List   Diagnosis Date Noted   Diastasis recti 01/26/2022   Abdominal pain, epigastric 01/26/2022   Medial meniscus, posterior horn derangement    Lateral meniscus tear, current    Primary osteoarthritis of right knee    GERD (gastroesophageal reflux disease) 07/16/2015   High cholesterol 07/16/2015   Essential hypertension 07/16/2015   Diabetes 07/16/2015    Past Medical History:  Diagnosis Date   Cancer    prostate 2013   Diabetes mellitus without complication    GERD (gastroesophageal reflux disease)    High cholesterol    Hypertension     Family History  Problem Relation Age of Onset   Diabetes Maternal Aunt    Past Surgical History:  Procedure Laterality Date   ANKLE SURGERY     KNEE ARTHROSCOPY WITH LATERAL MENISECTOMY Right 10/30/2015   Procedure: RIGHT KNEE ARTHROSCOPY WITH LATERAL AND MEDIAL MENISECTOMY;  Surgeon: Carole Civil, MD;  Location: [facility];  Service: Orthopedics;  Laterality: Right;   PROSTATE SURGERY     2013 Prostate cancer   THROAT SURGERY     Social History   Social History Narrative   Not on file    There is no immunization history on file for this patient.   Objective: Vital Signs: BP 114/71 (BP Location: Left Arm, Patient Position: Sitting, Cuff Size: Normal)   Pulse 72  Resp 14   Ht 5\' 7"  (1.702 m)   Wt 177 lb (80.3 kg)   BMI 27.72 kg/m    Physical Exam Vitals and nursing note reviewed.  Constitutional:      Appearance: He is well-developed.  HENT:     Head: Normocephalic and atraumatic.  Eyes:     Conjunctiva/sclera: Conjunctivae normal.     Pupils: Pupils are equal, round, and reactive to light.  Cardiovascular:     Rate and Rhythm: Normal rate and regular rhythm.     Heart sounds: Normal heart  sounds.  Pulmonary:     Effort: Pulmonary effort is normal.     Breath sounds: Normal breath sounds.  Abdominal:     General: Bowel sounds are normal.     Palpations: Abdomen is soft.  Musculoskeletal:     Cervical back: Normal range of motion and neck supple.  Skin:    General: Skin is warm and dry.     Capillary Refill: Capillary refill takes less than 2 seconds.  Neurological:     Mental Status: He is alert and oriented to person, place, and time.  Psychiatric:        Behavior: Behavior normal.      Musculoskeletal Exam: Apical spine was in good range of motion.  He had no tenderness over thoracic or lumbar spine.  Shoulder joints were both full range of motion.  He had good response to cortisone injection and had no effusion on the examination of his left shoulder joint today.  He had bilateral 5 degree contracture in his elbows.  CMC PIP and DIP thickening with no synovitis was noted.  There was no synovitis over wrist joints or MCP joints.  Tinel's and Phalen's test was positive.  He had limited range of motion of bilateral hip joints.  Warmth was noted in his right knee joint.  Left ankle joint swelling was noted.  There was no tenderness over MTPs.  CDAI Exam: CDAI Score: 3.8  Patient Global: 4 mm; Provider Global: 4 mm Swollen: 2 ; Tender: 3  Joint Exam 07/29/2022      Right  Left  Glenohumeral   Tender     Knee  Swollen Tender     Ankle     Swollen Tender     Investigation: No additional findings.  Imaging: No results found.  Recent Labs: Lab Results  Component Value Date   WBC 4.7 06/14/2022   HGB 12.5 (L) 06/14/2022   PLT 261 06/14/2022   NA 142 06/14/2022   K 4.9 06/14/2022   CL 106 06/14/2022   CO2 25 06/14/2022   GLUCOSE 97 06/14/2022   BUN 18 06/14/2022   CREATININE 1.29 (H) 06/14/2022   BILITOT 0.5 06/14/2022   ALKPHOS 43 02/04/2017   AST 18 06/14/2022   ALT 20 06/14/2022   PROT 7.4 06/14/2022   PROT 7.2 06/14/2022   ALBUMIN 3.8 02/04/2017    CALCIUM 9.1 06/14/2022   GFRAA >60 02/04/2017   QFTBGOLDPLUS NEGATIVE 06/14/2022   June 14, 2022 hepatitis B-, hepatitis C negative, SPEP normal, immunoglobulins normal, TB Gold negative, CK234, TSH normal, sed rate 11, RF negative, anti-CCP negative, uric acid 7.1, ANA 1: 320 nuclear, nucleolar, ENA (Smith, RNP, SSA, SSB, dsDNA, SCL 70) negative  Speciality Comments: No specialty comments available.  Procedures:  No procedures performed Allergies: Lisinopril and Sulfa antibiotics   Assessment / Plan:     Visit Diagnoses: Seronegative rheumatoid arthritis- RF negative, anti-CCP negative, ANA positive, synovitis in multiple  joint's.  He had left shoulder joint effusion.  Synovitis noted in the right knee and left ankle joint.  Erosive changes were noted in the left humeral head.  Narrowing of intercarpal joints and MCP joints was noted.  I did detailed discussion with patient regarding rheumatoid arthritis.  Detailed counseling was provided.  A handout on rheumatoid arthritis was given.  Different treatment options and their side effects were discussed.  Patient is hesitant to go on any immunosuppressive therapy.  Because he has elevated creatinine I discussed the option of possible low-dose leflunomide at 10 mg p.o. daily.  He would like the information.  The information was placed in the AVS.  If he decides to go on leflunomide in the future we can take a consent and start him on the medication. I plan to start him on leflunomide 10 mg p.o. daily if he is in agreement due to low GFR.  Positive ANA (antinuclear antibody) - ANA 1: 320, ENA negative.  He denies any history of oral ulcers, nasal ulcers, malar rash, photosensitivity, Raynaud's phenomenon or lymphadenopathy.  Acute pain of both shoulders - Synovitis in bilateral shoulders.  He had left shoulder joint synovitis.  Left shoulder was injected with lidocaine and cortisone last visit.  Good response to the injection and had good range  of motion of both shoulders without much discomfort.  X-rays showed erosive changes in the humeral head.  X-ray findings were reviewed with the patient.  Pain in both hands - History of pain and discomfort in bilateral hands.  No synovitis was noted.  X-rays showed MCP joint narrowing and intercarpal joint narrowing.  X-rays are consistent with chronic inflammatory arthritis most likely rheumatoid arthritis.  X-ray findings were reviewed with the patient.  Bilateral carpal tunnel syndrome - Phalen's and Tinel's were positive.  Nerve conduction velocity shows severe carpal tunnel syndrome.  Patient was evaluated by Dr.Xu who plans to do carpal tunnel release in the near future.  Chronic pain of right knee -he had warmth on palpation of his right knee.  Severe lateral compartment narrowing was noted on the x-rays.  X-ray findings were reviewed with the patient.  Left ankle swelling -he continues to have pain and swelling in his left ankle joint.  He had surgery on his left ankle in the past.  The x-rays showed postsurgical changes.  X-rays showed severe tibiotalar and subtalar joint space narrowing and erosive changes in the tibia.  High risk medication use -I did detailed discussion regarding use of leflunomide.  SPEP was normal, TB Gold was negative, immunoglobulins normal, hepatitis B negative, hepatitis C negative.  Lab results were discussed with the patient.  Once he reviews the side effects and if he is willing to start on leflunomide we can give him a trial.  Indications and side effects of leflunomide were discussed at length.  Recommendations regarding minimization was placed in the AVS.  Elevated CK-CK is mildly elevated at 234.  No further workup is required.  He had no muscular weakness or tenderness.  Stage 3a chronic kidney disease - GFR in 41s.  Patient is followed by his PCP.  Essential hypertension-blood pressure was normal at 114/71 today.  History of type 2 diabetes  mellitus  History of hyperlipidemia  Gastroesophageal reflux disease without esophagitis  Orders: No orders of the defined types were placed in this encounter.  No orders of the defined types were placed in this encounter.   Face-to-face time spent with patient was 30 minutes. Greater than  50% of time was spent in counseling and coordination of care.  Follow-Up Instructions: Return in about 3 months (around 10/28/2022) for Rheumatoid arthritis.   Bo Merino, MD  Note - This record has been created using Editor, commissioning.  Chart creation errors have been sought, but may not always  have been located. Such creation errors do not reflect on  the standard of medical care.

## 2022-07-28 NOTE — Progress Notes (Unsigned)
Office Visit Note   Patient: Nicholas Vaughan           Date of Birth: 04-19-1951           MRN: [id number] Visit Date: 07/29/2022              Requested by: Bo Merino, MD [address] [place],  [place] [address] PCP: Coolidge Breeze, FNP   Assessment & Plan: Visit Diagnoses:  1. Right carpal tunnel syndrome   2. Left carpal tunnel syndrome     Plan: Impression is 72 year old gentleman with severe bilateral carpal tunnel syndrome.  Nerve conduction study results were reviewed with the patient and given the severity I recommended carpal tunnel release.  We will start with the left 1.  Risk benefits prognosis reviewed.  Nicholas Vaughan will call the patient to confirm surgical time.  Follow-Up Instructions: No follow-ups on file.   Orders:  No orders of the defined types were placed in this encounter.  No orders of the defined types were placed in this encounter.     Procedures: No procedures performed   Clinical Data: No additional findings.   Subjective: Chief Complaint  Patient presents with   Left Hand - Pain   Right Hand - Pain    HPI  Patient is a very pleasant 72 year old gentleman referral from Dr. Quintella Baton for severe bilateral carpal tunnel syndrome.  Patient has had longstanding symptoms of numbness and pain and tingling in his hands.  Review of Systems  Constitutional: Negative.   HENT: Negative.    Eyes: Negative.   Respiratory: Negative.    Cardiovascular: Negative.   Gastrointestinal: Negative.   Endocrine: Negative.   Genitourinary: Negative.   Skin: Negative.   Allergic/Immunologic: Negative.   Neurological: Negative.   Hematological: Negative.   Psychiatric/Behavioral: Negative.    All other systems reviewed and are negative.    Objective: Vital Signs: There were no vitals taken for this visit.  Physical Exam Vitals and nursing note reviewed.  Constitutional:      Appearance: He is well-developed.  HENT:     Head:  Normocephalic and atraumatic.  Eyes:     Pupils: Pupils are equal, round, and reactive to light.  Pulmonary:     Effort: Pulmonary effort is normal.  Abdominal:     Palpations: Abdomen is soft.  Musculoskeletal:        General: Normal range of motion.     Cervical back: Neck supple.  Skin:    General: Skin is warm.  Neurological:     Mental Status: He is alert and oriented to person, place, and time.  Psychiatric:        Behavior: Behavior normal.        Thought Content: Thought content normal.        Judgment: Judgment normal.    Ortho Exam\\  Examination of the hands show mild thenar atrophy.  Dysesthesias to the median nerve distribution.  No neurovascular compromise.  Specialty Comments:  No specialty comments available.  Imaging: No results found.   PMFS History: Patient Active Problem List   Diagnosis Date Noted   Diastasis recti 01/26/2022   Abdominal pain, epigastric 01/26/2022   Medial meniscus, posterior horn derangement    Lateral meniscus tear, current    Primary osteoarthritis of right knee    GERD (gastroesophageal reflux disease) 07/16/2015   High cholesterol 07/16/2015   Essential hypertension 07/16/2015   Diabetes 07/16/2015   Past Medical History:  Diagnosis Date  Cancer    prostate 2013   Diabetes mellitus without complication    GERD (gastroesophageal reflux disease)    High cholesterol    Hypertension     Family History  Problem Relation Age of Onset   Diabetes Maternal Aunt     Past Surgical History:  Procedure Laterality Date   ANKLE SURGERY     KNEE ARTHROSCOPY WITH LATERAL MENISECTOMY Right 10/30/2015   Procedure: RIGHT KNEE ARTHROSCOPY WITH LATERAL AND MEDIAL MENISECTOMY;  Surgeon: Carole Civil, MD;  Location: [facility];  Service: Orthopedics;  Laterality: Right;   PROSTATE SURGERY     2013 Prostate cancer   THROAT SURGERY     Social History   Occupational History   Not on file  Tobacco Use   Smoking status: Some Days     Packs/day: 0.50    Years: 20.00    Additional pack years: 0.00    Total pack years: 10.00    Types: Cigarettes    Last attempt to quit: 10/23/2010    Years since quitting: 11.7   Smokeless tobacco: Never  Vaping Use   Vaping Use: Never used  Substance and Sexual Activity   Alcohol use: Yes    Alcohol/week: 0.0 standard drinks of alcohol    Comment: occasional once a week   Drug use: Yes    Types: Marijuana   Sexual activity: Not on file

## 2022-07-29 ENCOUNTER — Ambulatory Visit (INDEPENDENT_AMBULATORY_CARE_PROVIDER_SITE_OTHER): Payer: Medicare HMO | Admitting: Orthopaedic Surgery

## 2022-07-29 ENCOUNTER — Encounter: Payer: Self-pay | Admitting: Orthopaedic Surgery

## 2022-07-29 ENCOUNTER — Encounter: Payer: Self-pay | Admitting: Rheumatology

## 2022-07-29 ENCOUNTER — Ambulatory Visit: Payer: Medicare HMO | Attending: Rheumatology | Admitting: Rheumatology

## 2022-07-29 VITALS — BP 114/71 | HR 72 | Resp 14 | Ht 67.0 in | Wt 177.0 lb

## 2022-07-29 DIAGNOSIS — M79642 Pain in left hand: Secondary | ICD-10-CM

## 2022-07-29 DIAGNOSIS — G5601 Carpal tunnel syndrome, right upper limb: Secondary | ICD-10-CM

## 2022-07-29 DIAGNOSIS — M79641 Pain in right hand: Secondary | ICD-10-CM | POA: Diagnosis not present

## 2022-07-29 DIAGNOSIS — M25561 Pain in right knee: Secondary | ICD-10-CM

## 2022-07-29 DIAGNOSIS — Z8639 Personal history of other endocrine, nutritional and metabolic disease: Secondary | ICD-10-CM

## 2022-07-29 DIAGNOSIS — G8929 Other chronic pain: Secondary | ICD-10-CM

## 2022-07-29 DIAGNOSIS — R748 Abnormal levels of other serum enzymes: Secondary | ICD-10-CM

## 2022-07-29 DIAGNOSIS — G5602 Carpal tunnel syndrome, left upper limb: Secondary | ICD-10-CM

## 2022-07-29 DIAGNOSIS — Z79899 Other long term (current) drug therapy: Secondary | ICD-10-CM

## 2022-07-29 DIAGNOSIS — G5603 Carpal tunnel syndrome, bilateral upper limbs: Secondary | ICD-10-CM

## 2022-07-29 DIAGNOSIS — M25511 Pain in right shoulder: Secondary | ICD-10-CM

## 2022-07-29 DIAGNOSIS — R202 Paresthesia of skin: Secondary | ICD-10-CM

## 2022-07-29 DIAGNOSIS — K219 Gastro-esophageal reflux disease without esophagitis: Secondary | ICD-10-CM

## 2022-07-29 DIAGNOSIS — R768 Other specified abnormal immunological findings in serum: Secondary | ICD-10-CM | POA: Diagnosis not present

## 2022-07-29 DIAGNOSIS — M25512 Pain in left shoulder: Secondary | ICD-10-CM

## 2022-07-29 DIAGNOSIS — M25472 Effusion, left ankle: Secondary | ICD-10-CM

## 2022-07-29 DIAGNOSIS — N1831 Chronic kidney disease, stage 3a: Secondary | ICD-10-CM

## 2022-07-29 DIAGNOSIS — M0600 Rheumatoid arthritis without rheumatoid factor, unspecified site: Secondary | ICD-10-CM | POA: Diagnosis not present

## 2022-07-29 DIAGNOSIS — M25471 Effusion, right ankle: Secondary | ICD-10-CM

## 2022-07-29 DIAGNOSIS — I10 Essential (primary) hypertension: Secondary | ICD-10-CM

## 2022-07-29 NOTE — Patient Instructions (Signed)
Rheumatoid Arthritis Rheumatoid arthritis (RA) is a long-term (chronic) disease that causes inflammation in the joints. RA may start slowly. It most often affects the small joints of the hands and feet. Usually, the same joints are affected on both sides of the body. Inflammation from RA can also affect other parts of the body, including the heart, eyes, or lungs. There is no cure for RA, but medicines can help your symptoms and stop or slow down the progression of the disease. What are the causes? RA is an autoimmune disease. When you have an autoimmune disease, your body's defense system (immune system) mistakenly attacks healthy body tissues. The exact cause of RA is not known. What increases the risk? The following factors may make you more likely to develop this condition: Being male. Having a family history of RA or other autoimmune diseases. Having a history of smoking. Being obese. Having been exposed to pollutants or chemicals. What are the signs or symptoms? Symptoms of this condition usually start gradually. They are often worse in the morning. The first symptom may be morning stiffness that lasts longer than 30 minutes. As RA progresses, symptoms may include: Pain, stiffness, swelling, warmth, and tenderness in joints on both sides of your body. Loss of energy. Loss of appetite. Weight loss. Low-grade fever. Dry eyes and dry mouth. Firm lumps (rheumatoid nodules) that grow beneath your skin in areas such as your forearm bones near your elbows and on your hands. Changes in the appearance of joints (deformity) and loss of joint function. Symptoms of this condition vary from person to person. Symptoms of RA often come and go. Sometimes, symptoms get worse for a period of time. These are called flares. How is this diagnosed? This condition is diagnosed based on your symptoms, medical history, and a physical exam. You may have X-rays or an MRI to check for the type of joint  changes that are caused by RA. You may also have blood tests to look for: Proteins (antibodies) that your immune system may make if you have RA. These include rheumatoid factor (RF) and anti-CCP. When blood tests show these proteins, you are said to have "seropositive RA." When blood tests do not show these proteins, you may have "seronegative RA." Inflammation in your blood. A low number of red blood cells (anemia). How is this treated? The goals of treatment are to relieve pain, reduce inflammation, and slow down or stop joint damage and disability. Treatment may include: Lifestyle changes. It is important to rest as needed, eat a healthy diet, and exercise. Medicines. Your health care provider may adjust your medicines every 3 months until treatment goals are reached. Common medicines include: Pain relievers (analgesics). Corticosteroids and NSAIDs, such as ibuprofen, to reduce inflammation. Disease-modifying antirheumatic drugs (DMARDs) to try to slow the course of the disease. Biologic response modifiers to reduce inflammation and damage. Physical therapy and occupational therapy. Surgery, if you have severe joint damage. Joint replacement or fusing of joints may be needed. Your health care provider will work with you to identify the best treatment option for you based on assessment of the overall disease activity in your body. Follow these instructions at home: Managing pain, stiffness, and swelling If directed, apply heat to the affected area as often as told by your health care provider. Use the heat source that your health care provider recommends, such as a moist heat pack or a heating pad. Place a towel between your skin and the heat source. Leave the heat on for  20-30 minutes. Remove the heat if your skin turns bright red. This is especially important if you are unable to feel pain, heat, or cold. You have a greater risk of getting burned.  Activity Return to your normal  activities as told by your health care provider. Ask your health care provider what activities are safe for you. Rest when you are having a flare. Start an exercise program as told by your health care provider. This may include physical therapy exercises to maintain movement and strength in your joints. General instructions Take over-the-counter and prescription medicines only as told by your health care provider. Keep all follow-up visits. This is important. Where to find more information [employer] of Rheumatology: rheumatology.[place]: arthritis.org Contact a health care provider if: You have a flare-up of RA symptoms. You have a fever. You have side effects from your medicines. Get help right away if: You have chest pain. You have trouble breathing. You quickly develop a hot, painful joint that is more severe than your usual joint aches. These symptoms may be an emergency. Get help right away. Call 911. Do not wait to see if the symptoms will go away. Do not drive yourself to the hospital. Summary Rheumatoid arthritis (RA) is a long-term (chronic) disease that causes inflammation in the joints. RA is an autoimmune disease. The goals of treatment are to relieve pain, reduce inflammation, and slow down or stop joint damage and disability. This information is not intended to replace advice given to you by your health care provider. Make sure you discuss any questions you have with your health care provider. Document Revised: 02/12/2021 Document Reviewed: 02/12/2021 Elsevier Patient Education  [place].  Leflunomide Tablets What is this medication? LEFLUNOMIDE (le FLOO na mide) treats the symptoms of rheumatoid arthritis. It works by slowing down an overactive immune system. This decreases inflammation. It belongs to a group of medications called DMARDs. This medicine may be used for other purposes; ask your health care provider or pharmacist if you  have questions. COMMON BRAND NAME(S): Arava What should I tell my care team before I take this medication? They need to know if you have any of these conditions: Cancer Diabetes High blood pressure Immune system problems Infection Kidney disease Liver disease Low blood cell levels (white cells, red cells, and platelets) Lung or breathing disease, such as asthma or COPD Recent or upcoming vaccine Skin conditions Tingling of the fingers or toes, or other nerve disorder An unusual or allergic reaction to leflunomide, other medications, food, dyes, or preservatives Pregnant or trying to get pregnant Breastfeeding How should I use this medication? Take this medication by mouth with a full glass of water. Take it as directed on the prescription label at the same time every day. Keep taking it unless your care team tells you to stop. Talk to your care team about the use of this medication in children. Special care may be needed. Overdosage: If you think you have taken too much of this medicine contact a poison control center or emergency room at once. NOTE: This medicine is only for you. Do not share this medicine with others. What if I miss a dose? If you miss a dose, take it as soon as you can. If it is almost time for your next dose, take only that dose. Do not take double or extra doses. What may interact with this medication? Do not take this medication with any of the following: Teriflunomide This medication may also interact  with the following: Alosetron Caffeine Cefaclor Certain medications for diabetes, such as nateglinide, repaglinide, rosiglitazone, pioglitazone Certain medications for high cholesterol, such as atorvastatin, pravastatin, rosuvastatin, simvastatin Charcoal Cholestyramine Ciprofloxacin Duloxetine Estrogen and progestin hormones Furosemide Ketoprofen Live virus vaccines Medications that increase your risk for  infection Methotrexate Mitoxantrone Paclitaxel Penicillin Theophylline Tizanidine Warfarin This list may not describe all possible interactions. Give your health care provider a list of all the medicines, herbs, non-prescription drugs, or dietary supplements you use. Also tell them if you smoke, drink alcohol, or use illegal drugs. Some items may interact with your medicine. What should I watch for while using this medication? Visit your care team for regular checks on your progress. Tell your care team if your symptoms do not start to get better or if they get worse. You may need blood work done while you are taking this medication. This medication may cause serious skin reactions. They can happen weeks to months after starting the medication. Contact your care team right away if you notice fevers or flu-like symptoms with a rash. The rash may be red or purple and then turn into blisters or peeling of the skin. You may also notice a red rash with swelling of the face, lips, or lymph nodes in your neck or under your arms. You should not receive certain vaccines during your treatment and for a certain time after your treatment with this medication ends. Talk to your care team for more information. This medication may stay in your body for up to 2 years after your last dose. Tell your care team about any unusual side effects or symptoms. A medication can be given to help lower your blood levels of this medication more quickly. Talk to your care team if you may be pregnant. This medication can cause serious birth defects if taken during pregnancy and for a while after the last dose. You will need a negative pregnancy test before starting this medication. Contraception is recommended while taking this medication and for a while after the last dose. Your care team can help you find the option that works for you. Do not breastfeed while taking this medication. What side effects may I notice from receiving  this medication? Side effects that you should report to your care team as soon as possible: Allergic reactions--skin rash, itching, hives, swelling of the face, lips, tongue, or throat Dry cough, shortness of breath or trouble breathing Increase in blood pressure Infection--fever, chills, cough, sore throat, wounds that don't heal, pain or trouble when passing urine, general feeling of discomfort or being unwell Redness, blistering, peeling, or loosening of the skin, including inside the mouth Liver injury--right upper belly pain, loss of appetite, nausea, light-colored stool, dark yellow or brown urine, yellowing skin or eyes, unusual weakness or fatigue Pain, tingling, or numbness in the hands or feet Unusual bruising or bleeding Side effects that usually do not require medical attention (report to your care team if they continue or are bothersome): Back pain Diarrhea Hair loss Headache Nausea This list may not describe all possible side effects. Call your doctor for medical advice about side effects. You may report side effects to FDA at 1-800-FDA-1088. Where should I keep my medication? Keep out of the reach of children and pets. Store at room temperature between 20 and 25 degrees C (68 and 77 degrees F). Protect from moisture and light. Keep the container tightly closed. Get rid of any unused medication after the expiration date. To get rid of medications  that are no longer needed or have expired: Take the medication to a medication take-back program. Check with your pharmacy or law enforcement to find a location. If you cannot return the medication, ask your pharmacist or care team how to get rid of this medication safely. NOTE: This sheet is a summary. It may not cover all possible information. If you have questions about this medicine, talk to your doctor, pharmacist, or health care provider.  2023 Elsevier/Gold Standard (2021-09-08 00:00:00)  Standing Labs We placed an order  today for your standing lab work.   Please have your standing labs drawn in 2 weeks after starting the medication then every 3 months  Please have your labs drawn 2 weeks prior to your appointment so that the provider can discuss your lab results at your appointment, if possible.  Please note that you may see your imaging and lab results in [place] before we have reviewed them. We will contact you once all results are reviewed. Please allow our office up to 72 hours to thoroughly review all of the results before contacting the office for clarification of your results.  WALK-IN LAB HOURS  Monday through Thursday from 8:00 am -12:30 pm and 1:00 pm-5:00 pm and Friday from 8:00 am-12:00 pm.  Patients with office visits requiring labs will be seen before walk-in labs.  You may encounter longer than normal wait times. Please allow additional time. Wait times may be shorter on  Monday and Thursday afternoons.  We do not book appointments for walk-in labs. We appreciate your patience and understanding with our staff.   Labs are drawn by [facility]. Please bring your co-pay at the time of your lab draw.  You may receive a bill from [facility] for your lab work.  Please note if you are on Hydroxychloroquine and and an order has been placed for a Hydroxychloroquine level,  you will need to have it drawn 4 hours or more after your last dose.  If you wish to have your labs drawn at another location, please call the office 24 hours in advance so we can fax the orders.  The office is located at [address] [place], [place], [place] [address]   If you have any questions regarding directions or hours of operation,  please call [phone].   As a reminder, please drink plenty of water prior to coming for your lab work. Thanks!   Vaccines You are taking a medication(s) that can suppress your immune system.  The following immunizations are recommended: Flu annually Covid-19  RSV Td/Tdap (tetanus,  diphtheria, pertussis) every 10 years Pneumonia (Prevnar 15 then Pneumovax 23 at least 1 year apart.  Alternatively, can take Prevnar 20 without needing additional dose) Shingrix: 2 doses from 4 weeks to 6 months apart  If you have signs or symptoms of an infection or start antibiotics: First, call your PCP for workup of your infection. Hold your medication through the infection, until you complete your antibiotics, and until symptoms resolve if you take the following: Injectable medication (Actemra, Benlysta, Cimzia, Cosentyx, Enbrel, Humira, Kevzara, Orencia, Remicade, Simponi, Stelara, Taltz, Tremfya) Methotrexate Leflunomide (Arava) Mycophenolate (Cellcept) Roma Kayser, or Rinvoq   Please check with your PCP to make sure you are up to date.   Please stop the medication 1 week prior to the surgery and may restart 2 weeks after the surgery if there is no infection.

## 2022-08-24 ENCOUNTER — Other Ambulatory Visit: Payer: Self-pay | Admitting: Physician Assistant

## 2022-08-24 MED ORDER — ONDANSETRON HCL 4 MG PO TABS: 4.0000 mg | ORAL_TABLET | Freq: Three times a day (TID) | ORAL | 0 refills | Status: AC | PRN

## 2022-08-24 MED ORDER — HYDROCODONE-ACETAMINOPHEN 5-325 MG PO TABS: 1.0000 | ORAL_TABLET | Freq: Three times a day (TID) | ORAL | 0 refills | Status: AC | PRN

## 2022-08-26 ENCOUNTER — Other Ambulatory Visit: Payer: Self-pay | Admitting: Physician Assistant

## 2022-08-26 DIAGNOSIS — G5602 Carpal tunnel syndrome, left upper limb: Secondary | ICD-10-CM

## 2022-08-27 ENCOUNTER — Telehealth: Payer: Self-pay | Admitting: Orthopaedic Surgery

## 2022-08-27 NOTE — Telephone Encounter (Signed)
Yes

## 2022-08-27 NOTE — Telephone Encounter (Signed)
Patients daughter states she need a not stating she was with her father during his stay at hospital for surgery and today while he is home. She is asking to have it emailed to [email or web address]. this is for her work. 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

## 2022-09-03 ENCOUNTER — Encounter: Payer: Medicare HMO | Admitting: Internal Medicine

## 2022-09-07 ENCOUNTER — Encounter: Payer: Self-pay | Admitting: Physician Assistant

## 2022-09-07 ENCOUNTER — Ambulatory Visit (INDEPENDENT_AMBULATORY_CARE_PROVIDER_SITE_OTHER): Payer: Medicare HMO | Admitting: Physician Assistant

## 2022-09-07 DIAGNOSIS — G5602 Carpal tunnel syndrome, left upper limb: Secondary | ICD-10-CM

## 2022-09-07 DIAGNOSIS — Z9889 Other specified postprocedural states: Secondary | ICD-10-CM

## 2022-09-07 MED ORDER — OXYCODONE-ACETAMINOPHEN 5-325 MG PO TABS: 1.0000 | ORAL_TABLET | Freq: Two times a day (BID) | ORAL | 0 refills | Status: AC | PRN

## 2022-09-07 NOTE — Progress Notes (Signed)
Post-Op Visit Note   Patient: Nicholas Vaughan           Date of Birth: [birth year]           MRN: [id number] Visit Date: 09/07/2022 PCP: Wilmon Pali, FNP   Assessment & Plan:  Chief Complaint:  Chief Complaint  Patient presents with   Left Wrist - Follow-up    Left carpal tunnel release 08/26/2022   Visit Diagnoses:  1. Left carpal tunnel syndrome   2. S/P carpal tunnel release     Plan: Patient is a pleasant 72 year old gentleman who comes in today 12 days status post left carpal tunnel release 08/26/2022.  He has been doing okay.  He still complains of paresthesias to the index, long and ring fingertips.  He is also complaining of a moderate amount of pain to the entire wrist.  He notes that the Norco is not helping.  Examination of his left hand reveals a well-healed surgical incision with nylon sutures in place.  No evidence of infection or cellulitis.  Fingers are warm and well-perfused.  He is neurovascularly intact distally.  Today, sutures were removed and Steri-Strips applied.  No heavy lifting or submerging his hand underwater for another 2 weeks.  May begin nerve gliding exercises.  Follow-up with [place] in 2 weeks for recheck.  Have agreed to call in 1 prescription of oxycodone that will not be refilled.  If he needs an further pain medication, we will need to wean to tramadol or Tylenol with codeine.  Follow-Up Instructions: Return in about 4 weeks (around 10/05/2022).   Orders:  No orders of the defined types were placed in this encounter.  No orders of the defined types were placed in this encounter.   Imaging: No new imaging  PMFS History: Patient Active Problem List   Diagnosis Date Noted   Diastasis recti 01/26/2022   Abdominal pain, epigastric 01/26/2022   Medial meniscus, posterior horn derangement    Lateral meniscus tear, current    Primary osteoarthritis of right knee    GERD (gastroesophageal reflux disease) 07/16/2015   High cholesterol 07/16/2015    Essential hypertension 07/16/2015   Diabetes (HCC) 07/16/2015   Past Medical History:  Diagnosis Date   Cancer [facility])    prostate 2013   Diabetes mellitus without complication (HCC)    GERD (gastroesophageal reflux disease)    High cholesterol    Hypertension     Family History  Problem Relation Age of Onset   Diabetes Maternal Aunt     Past Surgical History:  Procedure Laterality Date   ANKLE SURGERY     KNEE ARTHROSCOPY WITH LATERAL MENISECTOMY Right 10/30/2015   Procedure: RIGHT KNEE ARTHROSCOPY WITH LATERAL AND MEDIAL MENISECTOMY;  Surgeon: Vickki Hearing, MD;  Location: [facility];  Service: Orthopedics;  Laterality: Right;   PROSTATE SURGERY     2013 Prostate cancer   THROAT SURGERY     Social History   Occupational History   Not on file  Tobacco Use   Smoking status: Some Days    Packs/day: 0.50    Years: 20.00    Additional pack years: 0.00    Total pack years: 10.00    Types: Cigarettes    Last attempt to quit: 10/23/2010    Years since quitting: 11.8   Smokeless tobacco: Never  Vaping Use   Vaping Use: Never used  Substance and Sexual Activity   Alcohol use: Yes    Alcohol/week: 0.0 standard drinks of alcohol  Comment: occasional once a week   Drug use: Not Currently    Types: Marijuana   Sexual activity: Not on file

## 2022-10-05 ENCOUNTER — Encounter: Payer: Medicare HMO | Admitting: Physician Assistant

## 2022-10-08 ENCOUNTER — Ambulatory Visit (INDEPENDENT_AMBULATORY_CARE_PROVIDER_SITE_OTHER): Payer: Medicare HMO | Admitting: Orthopaedic Surgery

## 2022-10-08 DIAGNOSIS — G5602 Carpal tunnel syndrome, left upper limb: Secondary | ICD-10-CM

## 2022-10-08 DIAGNOSIS — Z9889 Other specified postprocedural states: Secondary | ICD-10-CM

## 2022-10-08 NOTE — Progress Notes (Signed)
   Post-Op Visit Note   Patient: Nicholas Vaughan           Date of Birth: 24-Feb-1951           MRN: [id number] Visit Date: 10/08/2022 PCP: Wilmon Pali, FNP   Assessment & Plan:  Chief Complaint:  Chief Complaint  Patient presents with   Left Hand - Follow-up, Routine Post Op   Visit Diagnoses:  1. Left carpal tunnel syndrome   2. S/P carpal tunnel release     Plan: Rylei is a 6 weeks status post left carpal tunnel release.  Overall doing well.  He is sleeping much better.  Feels a little bit of tenderness around the base of the palm but otherwise he is very happy.  Examination left hand shows fully healed surgical scar.  He can make a full composite fist.  From my standpoint Rigo is made an excellent recovery from the carpal tunnel release.  He is released to activity as tolerated.  Questions encouraged and answered.  Follow-up as needed.  He will reach out to [place] at some point about getting the right carpal tunnel surgery done.  Follow-Up Instructions: No follow-ups on file.   Orders:  No orders of the defined types were placed in this encounter.  No orders of the defined types were placed in this encounter.   Imaging: No results found.  PMFS History: Patient Active Problem List   Diagnosis Date Noted   Diastasis recti 01/26/2022   Abdominal pain, epigastric 01/26/2022   Medial meniscus, posterior horn derangement    Lateral meniscus tear, current    Primary osteoarthritis of right knee    GERD (gastroesophageal reflux disease) 07/16/2015   High cholesterol 07/16/2015   Essential hypertension 07/16/2015   Diabetes (HCC) 07/16/2015   Past Medical History:  Diagnosis Date   Cancer [facility])    prostate 2013   Diabetes mellitus without complication (HCC)    GERD (gastroesophageal reflux disease)    High cholesterol    Hypertension     Family History  Problem Relation Age of Onset   Diabetes Maternal Aunt     Past Surgical History:  Procedure  Laterality Date   ANKLE SURGERY     KNEE ARTHROSCOPY WITH LATERAL MENISECTOMY Right 10/30/2015   Procedure: RIGHT KNEE ARTHROSCOPY WITH LATERAL AND MEDIAL MENISECTOMY;  Surgeon: Vickki Hearing, MD;  Location: [facility];  Service: Orthopedics;  Laterality: Right;   PROSTATE SURGERY     2013 Prostate cancer   THROAT SURGERY     Social History   Occupational History   Not on file  Tobacco Use   Smoking status: Some Days    Packs/day: 0.50    Years: 20.00    Additional pack years: 0.00    Total pack years: 10.00    Types: Cigarettes    Last attempt to quit: 10/23/2010    Years since quitting: 11.9   Smokeless tobacco: Never  Vaping Use   Vaping Use: Never used  Substance and Sexual Activity   Alcohol use: Yes    Alcohol/week: 0.0 standard drinks of alcohol    Comment: occasional once a week   Drug use: Not Currently    Types: Marijuana   Sexual activity: Not on file

## 2022-10-19 ENCOUNTER — Telehealth: Payer: Self-pay | Admitting: Orthopaedic Surgery

## 2022-10-19 NOTE — Telephone Encounter (Signed)
Received call from patient's daughter Mujtaba Bollig asked if the note stating that the patient had surgery and she needed  to be with him be written on [facility]? Lanita asked if the note can be emailed to her? [email or web address].    The number to contact Lanita is  [phone] or [phone]  Please see previous note

## 2022-10-19 NOTE — Telephone Encounter (Signed)
sure

## 2022-11-02 NOTE — Progress Notes (Deleted)
Office Visit Note  Patient: Nicholas Vaughan             Date of Birth: [birth year]           MRN: [id number]             PCP: Wilmon Pali, FNP Referring: Wilmon Pali, FNP Visit Date: 11/10/2022 Occupation: @GUAROCC @  Subjective:  No chief complaint on file.   History of Present Illness: Nicholas Vaughan is a 72 y.o. male ***     Activities of Daily Living:  Patient reports morning stiffness for *** {minute/hour:19697}.   Patient {ACTIONS;DENIES/REPORTS:21021675::"Denies"} nocturnal pain.  Difficulty dressing/grooming: {ACTIONS;DENIES/REPORTS:21021675::"Denies"} Difficulty climbing stairs: {ACTIONS;DENIES/REPORTS:21021675::"Denies"} Difficulty getting out of chair: {ACTIONS;DENIES/REPORTS:21021675::"Denies"} Difficulty using hands for taps, buttons, cutlery, and/or writing: {ACTIONS;DENIES/REPORTS:21021675::"Denies"}  No Rheumatology ROS completed.   PMFS History:  Patient Active Problem List   Diagnosis Date Noted   Diastasis recti 01/26/2022   Abdominal pain, epigastric 01/26/2022   Medial meniscus, posterior horn derangement    Lateral meniscus tear, current    Primary osteoarthritis of right knee    GERD (gastroesophageal reflux disease) 07/16/2015   High cholesterol 07/16/2015   Essential hypertension 07/16/2015   Diabetes (HCC) 07/16/2015    Past Medical History:  Diagnosis Date   Cancer [facility])    prostate 2013   Diabetes mellitus without complication (HCC)    GERD (gastroesophageal reflux disease)    High cholesterol    Hypertension     Family History  Problem Relation Age of Onset   Diabetes Maternal Aunt    Past Surgical History:  Procedure Laterality Date   ANKLE SURGERY     KNEE ARTHROSCOPY WITH LATERAL MENISECTOMY Right 10/30/2015   Procedure: RIGHT KNEE ARTHROSCOPY WITH LATERAL AND MEDIAL MENISECTOMY;  Surgeon: Vickki Hearing, MD;  Location: [facility];  Service: Orthopedics;  Laterality: Right;   PROSTATE SURGERY     2013 Prostate cancer    THROAT SURGERY     Social History   Social History Narrative   Not on file    There is no immunization history on file for this patient.   Objective: Vital Signs: There were no vitals taken for this visit.   Physical Exam   Musculoskeletal Exam: ***  CDAI Exam: CDAI Score: -- Patient Global: --; Provider Global: -- Swollen: --; Tender: -- Joint Exam 11/10/2022   No joint exam has been documented for this visit   There is currently no information documented on the homunculus. Go to the Rheumatology activity and complete the homunculus joint exam.  Investigation: No additional findings.  Imaging: No results found.  Recent Labs: Lab Results  Component Value Date   WBC 4.7 06/14/2022   HGB 12.5 (L) 06/14/2022   PLT 261 06/14/2022   NA 142 06/14/2022   K 4.9 06/14/2022   CL 106 06/14/2022   CO2 25 06/14/2022   GLUCOSE 97 06/14/2022   BUN 18 06/14/2022   CREATININE 1.29 (H) 06/14/2022   BILITOT 0.5 06/14/2022   ALKPHOS 43 02/04/2017   AST 18 06/14/2022   ALT 20 06/14/2022   PROT 7.4 06/14/2022   PROT 7.2 06/14/2022   ALBUMIN 3.8 02/04/2017   CALCIUM 9.1 06/14/2022   GFRAA >60 02/04/2017   QFTBGOLDPLUS NEGATIVE 06/14/2022    Speciality Comments: No specialty comments available.  Procedures:  No procedures performed Allergies: Lisinopril and Sulfa antibiotics   Assessment / Plan:     Visit Diagnoses: No diagnosis found.  Orders: No orders of the defined types were placed  in this encounter.  No orders of the defined types were placed in this encounter.   Face-to-face time spent with patient was *** minutes. Greater than 50% of time was spent in counseling and coordination of care.  Follow-Up Instructions: No follow-ups on file.   Ellen Henri, CMA  Note - This record has been created using Animal nutritionist.  Chart creation errors have been sought, but may not always  have been located. Such creation errors do not reflect on  the standard  of medical care.

## 2022-11-10 ENCOUNTER — Ambulatory Visit: Payer: Medicare HMO | Admitting: Rheumatology

## 2022-11-10 DIAGNOSIS — I10 Essential (primary) hypertension: Secondary | ICD-10-CM

## 2022-11-10 DIAGNOSIS — K219 Gastro-esophageal reflux disease without esophagitis: Secondary | ICD-10-CM

## 2022-11-10 DIAGNOSIS — R748 Abnormal levels of other serum enzymes: Secondary | ICD-10-CM

## 2022-11-10 DIAGNOSIS — M0600 Rheumatoid arthritis without rheumatoid factor, unspecified site: Secondary | ICD-10-CM

## 2022-11-10 DIAGNOSIS — M25511 Pain in right shoulder: Secondary | ICD-10-CM

## 2022-11-10 DIAGNOSIS — N1831 Chronic kidney disease, stage 3a: Secondary | ICD-10-CM

## 2022-11-10 DIAGNOSIS — M79641 Pain in right hand: Secondary | ICD-10-CM

## 2022-11-10 DIAGNOSIS — Z8639 Personal history of other endocrine, nutritional and metabolic disease: Secondary | ICD-10-CM

## 2022-11-10 DIAGNOSIS — R768 Other specified abnormal immunological findings in serum: Secondary | ICD-10-CM

## 2022-11-10 DIAGNOSIS — G5603 Carpal tunnel syndrome, bilateral upper limbs: Secondary | ICD-10-CM

## 2022-11-10 DIAGNOSIS — M25472 Effusion, left ankle: Secondary | ICD-10-CM

## 2022-11-10 DIAGNOSIS — Z79899 Other long term (current) drug therapy: Secondary | ICD-10-CM

## 2022-11-10 DIAGNOSIS — G8929 Other chronic pain: Secondary | ICD-10-CM

## 2023-04-27 ENCOUNTER — Encounter (HOSPITAL_COMMUNITY): Payer: Self-pay | Admitting: Emergency Medicine

## 2023-04-27 ENCOUNTER — Other Ambulatory Visit: Payer: Self-pay

## 2023-04-27 ENCOUNTER — Emergency Department (HOSPITAL_COMMUNITY): Payer: Medicare HMO

## 2023-04-27 ENCOUNTER — Emergency Department (HOSPITAL_COMMUNITY)
Admission: EM | Admit: 2023-04-27 | Discharge: 2023-04-27 | Disposition: A | Discharge status: Home / Self Care | Payer: Medicare HMO | Attending: Emergency Medicine | Admitting: Emergency Medicine

## 2023-04-27 DIAGNOSIS — M25512 Pain in left shoulder: Secondary | ICD-10-CM | POA: Diagnosis present

## 2023-04-27 DIAGNOSIS — R202 Paresthesia of skin: Secondary | ICD-10-CM | POA: Diagnosis not present

## 2023-04-27 DIAGNOSIS — G8929 Other chronic pain: Secondary | ICD-10-CM

## 2023-04-27 DIAGNOSIS — Z7982 Long term (current) use of aspirin: Secondary | ICD-10-CM | POA: Insufficient documentation

## 2023-04-27 MED ORDER — PREDNISONE 20 MG PO TABS: 40.0000 mg | ORAL_TABLET | Freq: Every day | ORAL | 0 refills | Status: AC

## 2023-04-27 NOTE — Discharge Instructions (Addendum)
 You have been prescribed prednisone  for your shoulder pain.  This can temporarily elevate your blood sugar levels.  Is important that you monitor your blood sugar daily while taking the prednisone .  If your blood sugars become too elevated please discontinue the prednisone .  You may also try over-the-counter 4% lidocaine  patches.  You can buy these at the drugstore or [facility] without a prescription.  Apply to your left shoulder as directed.  Please follow-up with your orthopedic provider for recheck.

## 2023-04-27 NOTE — ED Provider Notes (Signed)
 [place] EMERGENCY DEPARTMENT AT [facility] Provider Note   CSN: [id number] Arrival date & time: 04/27/23  9156     History  Chief Complaint  Patient presents with   Shoulder Pain    Nicholas Vaughan is a 73 y.o. male.   Shoulder Pain Associated symptoms: no fever and no neck pain        Nicholas Vaughan is a 73 y.o. male who presents to the Emergency Department complaining of gradually worsening left shoulder pain x 6 months.  Describes aching pain of his left shoulder that is constant and he has intermittent stabbing pains at the left shoulder joint.  He describes having radiating pain into his left arm down to his fingers.  He has numbness of his fingers which she states is chronic and had carpal tunnel release earlier this year without improvement of his symptoms.  He believes that onset of his left shoulder pain started sometime around the time of his carpal tunnel surgery.  He denies any neck pain, chest pain, shortness of breath weakness of his upper extremities or rash.  No known injury.  Pain worsens with attempted rotation and movement of his left shoulder.  He is right hand dominant.   Home Medications Prior to Admission medications   Medication Sig Start Date End Date Taking? Authorizing Provider  Accu-Chek Softclix Lancets lancets Accu-Chek Softclix Lancets    [provider]  acetaminophen -codeine  (TYLENOL  #4) 300-60 MG tablet Take 1 tablet by mouth every 4 (four) hours as needed for moderate pain. 11/13/15   Margrette Taft BRAVO, MD  albuterol (PROVENTIL) (2.5 MG/3ML) 0.083% nebulizer solution SMARTSIG:3 Milliliter(s) By Mouth Every 4 Hours PRN 02/18/22   [provider]  albuterol (VENTOLIN HFA) 108 (90 Base) MCG/ACT inhaler Inhale into the lungs. 03/12/22   [provider]  aspirin EC 81 MG tablet Take 81 mg by mouth daily.      [provider]  atenolol (TENORMIN) 25 MG tablet take 1 tablet every day Orally Once a day  for 90 days    [provider]  atorvastatin (LIPITOR) 80 MG tablet TAKE 1 TABLET BY MOUTH ONCE DAILY. Orally Once a day for 90 days    [provider]  Blood Glucose Monitoring Suppl (GLUCOCOM BLOOD GLUCOSE MONITOR) DEVI Accu-Chek Aviva Plus Meter    [provider]  cetirizine (ZYRTEC) 10 MG tablet TAKE 1 TABLET EVERY DAY Orally Once a day for 90 days    [provider]  clotrimazole (MYCELEX) 10 MG troche Take by mouth. 04/22/22   [provider]  fluticasone (FLONASE) 50 MCG/ACT nasal spray Place into the nose. 10/15/13   [provider]  gabapentin (NEURONTIN) 300 MG capsule Take 2 capsules by mouth at bedtime. 09/27/17   [provider]  glucose blood (ACCU-CHEK AVIVA PLUS) test strip Accu-Chek Aviva Plus test strips    [provider]  HYDROcodone -acetaminophen  (NORCO) 5-325 MG tablet Take 1 tablet by mouth 3 (three) times daily as needed. To be taken after surgery 08/24/22   Jule Ronal CROME, PA-C  metFORMIN (GLUCOPHAGE) 1000 MG tablet Take 1,000 mg by mouth 2 (two) times daily with a meal.    [provider]  Mouthwashes (BL ANTISEPTIC MOUTH WASH MT) SWISH AND SPIT 15mLS TWICE DAILY FOR 7 DAYS. 12/09/20   [provider]  omeprazole (PRILOSEC) 20 MG capsule Take 40 mg by mouth daily.     [provider]  ondansetron  (ZOFRAN ) 4 MG tablet Take 1 tablet (  4 mg total) by mouth every 8 (eight) hours as needed for nausea or vomiting. 08/24/22   Jule Ronal CROME, PA-C  oxyCODONE -acetaminophen  (PERCOCET) 5-325 MG tablet Take 1 tablet by mouth 2 (two) times daily as needed. 09/07/22   Jule Ronal CROME, PA-C  prednisoLONE acetate (PRED FORTE) 1 % ophthalmic suspension Place into the left eye. 04/13/22   [provider]  senna-docusate (SENOKOT-S) 8.6-50 MG tablet Take 1 tablet by mouth daily as needed for mild constipation or moderate constipation.    [provider]  Simethicone (GAS-X PO)  Take 1 tablet by mouth as needed.    [provider]      Allergies    Lisinopril and Sulfa antibiotics    Review of Systems   Review of Systems  Constitutional:  Negative for appetite change, chills and fever.  Respiratory:  Negative for cough and shortness of breath.   Cardiovascular:  Negative for chest pain.  Gastrointestinal:  Negative for abdominal pain, nausea and vomiting.  Musculoskeletal:  Positive for arthralgias. Negative for neck pain and neck stiffness.  Skin:  Negative for rash.  Neurological:  Negative for dizziness, weakness, numbness and headaches.    Physical Exam Updated Vital Signs BP 126/81   Pulse 67   Temp 98.5 F (36.9 C) (Oral)   Resp 15   SpO2 97%  Physical Exam Vitals and nursing note reviewed.  Constitutional:      General: He is not in acute distress.    Appearance: Normal appearance. He is not ill-appearing or toxic-appearing.  Cardiovascular:     Rate and Rhythm: Normal rate and regular rhythm.     Pulses: Normal pulses.  Pulmonary:     Effort: Pulmonary effort is normal.  Abdominal:     Palpations: Abdomen is soft.     Tenderness: There is no abdominal tenderness.  Musculoskeletal:        General: Tenderness present. No swelling, deformity or signs of injury.     Left shoulder: Tenderness and bony tenderness present. No swelling, deformity or crepitus. Decreased range of motion. Normal strength. Normal pulse.     Right lower leg: No edema.     Left lower leg: No edema.     Comments: Localized tenderness to palpation over the anterior left shoulder joint.  Range of motion is decreased due to level of pain.  Pain worsens with attempted abduction.  Grip strength is symmetrical bilaterally.  Full range of motion of the fingers of the left hand.  Skin:    General: Skin is warm.     Capillary Refill: Capillary refill takes less than 2 seconds.  Neurological:     General: No focal deficit present.     Mental Status: He is alert.      Sensory: No sensory deficit.     Motor: No weakness.     ED Results / Procedures / Treatments   Labs (all labs ordered are listed, but only abnormal results are displayed) Labs Reviewed - No data to display  EKG EKG Interpretation Date/Time:  Wednesday April 27 2023 09:44:21 EST Ventricular Rate:  59 PR Interval:  170 QRS Duration:  95 QT Interval:  388 QTC Calculation: 385 R Axis:   -16  Text Interpretation: Sinus rhythm Borderline left axis deviation Confirmed by Cleotilde Rogue ([id number]) on 04/27/2023 10:19:50 AM  Radiology DG Shoulder Left Result Date: 04/27/2023 CLINICAL DATA:  Left shoulder pain. EXAM: LEFT SHOULDER - 2+ VIEW COMPARISON:  06/14/2022 FINDINGS: There is no sign of  acute fracture or dislocation. There is marked narrowing of the acromial humeral interval with sub acromial spurring. Mild degenerative changes noted at the glenohumeral joint. Soft tissues are unremarkable. IMPRESSION: 1. No acute findings. 2. Marked narrowing of the acromial humeral interval with subacromial spurring. Findings are compatible with chronic rotator cuff tear. Electronically Signed   By: Waddell Calk M.D.   On: 04/27/2023 10:11    Procedures Procedures    Medications Ordered in ED Medications - No data to display  ED Course/ Medical Decision Making/ A&P                                 Medical Decision Making Patient here with chronic left shoulder pain.  Has history of RA.  No known recent injury.  His symptoms have been present for 6 months gradually worsening.  He does endorse taking a course of prednisone  several months ago that temporarily helped.  On review of medical records, patient had x-ray of the left shoulder February 2024 that showed narrowing of the acromioclavicular joint and erosive changes of the humeral head  I suspect this is chronic joint pain.  His exam is indicative of joint pain.  No clinical findings to suggest a septic joint.  Radiculopathy, ACS also  considered, but felt less likely.  Amount and/or Complexity of Data Reviewed Radiology: ordered.    Details: X-ray today shows marked narrowing of the acromial humeral interval with subacromial spurring.  Suggestive of chronic rotator cuff tear ECG/medicine tests: ordered.    Details: EKG shows sinus rhythm with borderline left axis deviation Discussion of management or test interpretation with external provider(s):   Chronic left shoulder pain, no fracture or dislocation.  Neurovascularly intact.  Patient has a history of diabetes but states he has been told he is longer a diabetic.  Continues to take his metformin.  Will provide short course of prednisone .  Patient advised to monitor his blood sugars closely while taking the medication and to discontinue use if his blood sugars become elevated.  He will follow back up with his orthopedic provider.  Risk Prescription drug management.           Final Clinical Impression(s) / ED Diagnoses Final diagnoses:  Chronic left shoulder pain    Rx / DC Orders ED Discharge Orders     None         Herlinda Milling, PA-C 04/27/23 1126    Cleotilde Rogue, MD 04/28/23 [phone]

## 2023-04-27 NOTE — ED Triage Notes (Signed)
 Pt reports left shoulder pain "for months." Denies injury to the area. Pain is worse with palpation.
